# Patient Record
Sex: Female | Born: 1955 | Race: Black or African American | Hispanic: No | Marital: Married | Smoking: Former smoker
Health system: Southern US, Community
[De-identification: ages and names within clinical notes are randomized; demographics above are authoritative.]

## PROBLEM LIST (undated history)

## (undated) DIAGNOSIS — I10 Essential (primary) hypertension: Secondary | ICD-10-CM

## (undated) DIAGNOSIS — Z8719 Personal history of other diseases of the digestive system: Secondary | ICD-10-CM

## (undated) DIAGNOSIS — K219 Gastro-esophageal reflux disease without esophagitis: Secondary | ICD-10-CM

## (undated) HISTORY — PX: TUBAL LIGATION: SHX77

## (undated) HISTORY — DX: Gastro-esophageal reflux disease without esophagitis: K21.9

## (undated) HISTORY — PX: BREAST BIOPSY: SHX20

## (undated) HISTORY — DX: Essential (primary) hypertension: I10

---

## 2020-01-15 ENCOUNTER — Encounter: Payer: Self-pay | Admitting: Physician Assistant

## 2020-02-07 DIAGNOSIS — E7800 Pure hypercholesterolemia, unspecified: Secondary | ICD-10-CM | POA: Insufficient documentation

## 2020-02-07 DIAGNOSIS — K219 Gastro-esophageal reflux disease without esophagitis: Secondary | ICD-10-CM | POA: Insufficient documentation

## 2020-02-07 DIAGNOSIS — I10 Essential (primary) hypertension: Secondary | ICD-10-CM

## 2020-02-08 ENCOUNTER — Ambulatory Visit (INDEPENDENT_AMBULATORY_CARE_PROVIDER_SITE_OTHER): Payer: Commercial Managed Care - PPO | Admitting: Physician Assistant

## 2020-02-08 ENCOUNTER — Encounter: Payer: Self-pay | Admitting: Physician Assistant

## 2020-02-08 VITALS — BP 122/70 | HR 76 | Ht 62.0 in | Wt 194.4 lb

## 2020-02-08 DIAGNOSIS — K219 Gastro-esophageal reflux disease without esophagitis: Secondary | ICD-10-CM | POA: Diagnosis not present

## 2020-02-08 DIAGNOSIS — R1310 Dysphagia, unspecified: Secondary | ICD-10-CM

## 2020-02-08 MED ORDER — FAMOTIDINE 40 MG PO TABS: 40.0000 mg | ORAL_TABLET | Freq: Every day | ORAL | 11 refills | Status: DC

## 2020-02-08 MED ORDER — PANTOPRAZOLE SODIUM 40 MG PO TBEC
40.0000 mg | DELAYED_RELEASE_TABLET | Freq: Every morning | ORAL | 11 refills | Status: DC
Start: 2020-02-08 — End: 2020-05-12

## 2020-02-08 NOTE — Patient Instructions (Signed)
If you are age 63 or older, your body mass index should be between 23-30. Your Body mass index is 35.56 kg/m. If this is out of the aforementioned range listed, please consider follow up with your Primary Care Provider.  If you are age 46 or younger, your body mass index should be between 19-25. Your Body mass index is 35.56 kg/m. If this is out of the aformentioned range listed, please consider follow up with your Primary Care Provider.   You have been scheduled for an endoscopy. Please follow written instructions given to you at your visit today. If you use inhalers (even only as needed), please bring them with you on the day of your procedure.  Continue Pantoprazole 40 mg every morning. Start Famotidine 40 mg 1 tablet at be time.  Follow up Antireflux diet.  Follow up pending the results of your Endoscopy.

## 2020-02-08 NOTE — Progress Notes (Signed)
____________________________________________________________  Attending physician addendum:  Thank you for sending this case to me. I have reviewed the entire note and agree with the plan.   Dakwon Wenberg Danis, MD  ____________________________________________________________  

## 2020-02-08 NOTE — Progress Notes (Signed)
Subjective:    Patient ID: Destiny Woods, female    DOB: 1955/12/06, 64 y.o.   MRN: [id number]  HPI  Destiny Woods is a pleasant 64 year old African-American female, new to GI today referred by [facility]/Destiny Woods for evaluation of somewhat refractory GERD. Patient has not had prior EGD.  She reports having a colonoscopy done for screening in 2016 or 2017 at [facility] and was told to follow-up in 10 years with negative exam. She has history of hypertension, hyperlipidemia and obesity. She says she has been struggling with reflux symptoms over the past 3 to 4 years.  She has been on Nexium at one point, and now on Protonix over the past couple of years.  She feels the medication helps but does not control her symptoms well.  Despite taking Protonix 40 mg p.o. every morning she is continuing to have heartburn off and on all day long and frequent nocturnal symptoms with awakening with sour brash coughing and choking.  She is trying to sit up for at least 3 hours after eating prior to bedtime.  She endorses vague dysphagia symptoms with a sense of food sitting in her chest at times, no episodes of regurgitation.  She denies any abdominal pain or discomfort.  She has had problems in the past with hoarseness and did have ENT evaluation within the past couple of years which she was told was negative. Review of Systems Pertinent positive and negative review of systems were noted in the above HPI section.  All other review of systems was otherwise negative.  Outpatient Encounter Medications as of 02/08/2020  Medication Sig  . acetaminophen (TYLENOL 8 HOUR ARTHRITIS PAIN) 650 MG CR tablet Take 650 mg by mouth every 8 (eight) hours as needed for pain.  Marland Kitchen AMBULATORY NON FORMULARY MEDICATION Medication Name: Extra Strength Heartburn & Gas Chews- Use as needed  . Cal Carb-Mag Hydrox-Simeth (ROLAIDS ADVANCED PO) Take 2 tablets by mouth as needed.  . calcium carbonate (OS-CAL) 600 MG  TABS tablet Take 600 mg by mouth 2 (two) times daily with a meal.  . Cetirizine HCl (WAL-ZYR PO) Take 1 tablet by mouth daily as needed.  . Multiple Vitamins-Minerals (MULTIVITAMIN WOMENS 50+ ADV PO) Take 1 tablet by mouth daily.  . Omega-3 Fatty Acids (FISH OIL OMEGA-3 PO) Take 1 tablet by mouth daily.  . pantoprazole (PROTONIX) 40 MG tablet Take 1 tablet (40 mg total) by mouth in the morning.  . triamterene-hydrochlorothiazide (MAXZIDE-25) 37.5-25 MG tablet Take 1 tablet by mouth daily.  . valsartan-hydrochlorothiazide (DIOVAN-HCT) 80-12.5 MG tablet Take 1 tablet by mouth daily.  . Vitamin D, Ergocalciferol, 50 MCG (2000 UT) CAPS Take 1 capsule by mouth daily.  . [DISCONTINUED] pantoprazole (PROTONIX) 40 MG tablet Take 40 mg by mouth daily.  . [DISCONTINUED] ranitidine (ZANTAC) 150 MG capsule Take 150 mg by mouth as needed for heartburn.  . famotidine (PEPCID) 40 MG tablet Take 1 tablet (40 mg total) by mouth at bedtime.  . [DISCONTINUED] Biotin 5 MG CAPS Take 1 capsule by mouth daily.  . [DISCONTINUED] Cholecalciferol (VITAMIN D) 125 MCG (5000 UT) CAPS Take 1 tablet by mouth daily.  . [DISCONTINUED] Omega-3-6-9 CAPS Take 1 capsule by mouth daily.  . [DISCONTINUED] sucralfate (CARAFATE) 1 g tablet Take 1 g by mouth 4 (four) times daily -  with meals and at bedtime.   No facility-administered encounter medications on file as of 02/08/2020.   No Known Allergies Patient Active Problem List   Diagnosis Date Noted  .  HTN (hypertension) 02/07/2020  . GERD (gastroesophageal reflux disease) 02/07/2020  . Hypercholesteremia 02/07/2020   Social History   Socioeconomic History  . Marital status: Married    Spouse name: Not on file  . Number of children: Not on file  . Years of education: Not on file  . Highest education level: Not on file  Occupational History  . Not on file  Tobacco Use  . Smoking status: Former Games developer  . Smokeless tobacco: Never Used  Vaping Use  . Vaping Use: Never  used  Substance and Sexual Activity  . Alcohol use: Never  . Drug use: Not on file  . Sexual activity: Not on file  Other Topics Concern  . Not on file  Social History Narrative  . Not on file   Social Determinants of Health   Financial Resource Strain:   . Difficulty of Paying Living Expenses: Not on file  Food Insecurity:   . Worried About Programme researcher, broadcasting/film/video in the Last Year: Not on file  . Ran Out of Food in the Last Year: Not on file  Transportation Needs:   . Lack of Transportation (Medical): Not on file  . Lack of Transportation (Non-Medical): Not on file  Physical Activity:   . Days of Exercise per Week: Not on file  . Minutes of Exercise per Session: Not on file  Stress:   . Feeling of Stress : Not on file  Social Connections:   . Frequency of Communication with Friends and Family: Not on file  . Frequency of Social Gatherings with Friends and Family: Not on file  . Attends Religious Services: Not on file  . Active Member of Clubs or Organizations: Not on file  . Attends Banker Meetings: Not on file  . Marital Status: Not on file  Intimate Partner Violence:   . Fear of Current or Ex-Partner: Not on file  . Emotionally Abused: Not on file  . Physically Abused: Not on file  . Sexually Abused: Not on file    [name]'s family history is not on file.      Objective:    Vitals:   02/08/20 1129  BP: 122/70  Pulse: 76    Physical Exam Well-developed well-nourished AA female  in no acute distress.  Height, JHERDE,081 BMI 35.5  HEENT; nontraumatic normocephalic, EOMI, PE R LA, sclera anicteric. Oropharynx; not done Neck; supple, no JVD Cardiovascular; regular rate and rhythm with S1-S2, no murmur rub or gallop Pulmonary; Clear bilaterally Abdomen; soft, nontender, nondistended, no palpable mass or hepatosplenomegaly, bowel sounds are active Rectal;not done Skin; benign exam, no jaundice rash or appreciable lesions Extremities; no clubbing  cyanosis or edema skin warm and dry Neuro/Psych; alert and oriented x4, grossly nonfocal mood and affect appropriate       Assessment & Plan:   #47  64 year old female with chronic refractory GERD despite Protonix 40 mg p.o. every morning.  Symptoms manifested by daytime heartburn and indigestion and frequent nocturnal episodes of sour brash with coughing and choking. Patient also endorses vague dysphagia, consider peptic stricture.  #2 colon cancer screening-up-to-date with negative colonoscopy through [facility] 2016/17 and indicated for 10-year interval follow-up. #3 Hypertension #4 Hyperlipidemia #5 Obesity  Plan; continue Protonix 40 mg p.o. every morning AC breakfast, refill sent Add trial of famotidine 40 mg at at bedtime, prescription and refill sent. Reviewed an antireflux diet and antireflux regimen including n.p.o. for 3 hours prior to bedtime and elevation of the back about 45  degrees while sleeping. Patient will be scheduled for EGD with Dr. Myrtie Neither.  Possible esophageal dilation.  Procedure discussed in detail with patient including indications risks and benefits and she is agreeable to proceed. Patient has completed COVID-19 vaccination. Further recommendations pending findings at EGD. May need pH study/manometry. Kimerly Rowand S Jaelle Campanile PA-C 02/08/2020   Cc: Donetta Potts, MD

## 2020-03-19 ENCOUNTER — Telehealth: Payer: Self-pay | Admitting: Gastroenterology

## 2020-03-19 NOTE — Telephone Encounter (Signed)
Lm on vm for patient to return call 

## 2020-03-19 NOTE — Telephone Encounter (Signed)
Dr. Rhea Belton as DOD on 03/19/20  This is a Dr. Myrtie Neither patient - recently seen for GERD and dysphagia. Patient was started on Famotidine 40 mg at bedtime on 02/08/20.  Spoke with patient, she states that she started taking Pepcid on 02/08/20, about 2 weeks ago she noticed swelling in her face, a red rash on face and neck - fine bumps, whelps, stopped taking Pepcid for the last couple of days, whelps are gone, she states that she can't sleep at night due to coughing, coughing for about 2 weeks - still has cough but it's mostly at night. No trouble breathing, pt states that she did notice that she caught herself wheezing at times - no history of asthma, no history of COPD or other respiratory issues. Pt states that she took Tylenol every now and then - she is not sure if this has anything to do with it. Pt states that she has not taken any allergy medications, she states that she used some hydrocortisone cream for her face that her dermatologist had prescribed in the summer but it did not help with the itching. Pt denies any new foods or possible irritants. Advised patient to take a non-drowsy Benadryl to see if this helps with the itching and rash. Advised that I will send this to the DOD as they may want to change her medication. Please advise, thank you.

## 2020-03-19 NOTE — Telephone Encounter (Signed)
Pepcid is not uncommon allergy as this is a histamine blocking medication but her symptoms could certainly be an allergic reaction Agree with remaining off of the Pepcid Given nocturnal symptoms raising question of reflux despite pantoprazole 40 mg in the morning I would recommend she increase her pantoprazole to 40 mg twice daily before her first and last meal the day She should proceed with upper endoscopy as scheduled with Dr. Myrtie Neither in February

## 2020-03-20 ENCOUNTER — Other Ambulatory Visit: Payer: Self-pay

## 2020-03-20 NOTE — Telephone Encounter (Signed)
Spoke with patient and advised of Dr. Lauro Franklin recommendations. Advised patient to remain off of Pepcid and to increase her Pantoprazole to 40 mg twice a day, patient is aware that she needs to take medication 30 minutes before her first and last meal of the day. Advised patient to keep EGD as scheduled in February. Patient verbalized understanding of all information and had no concerns at the end of the call.

## 2020-03-23 NOTE — Telephone Encounter (Signed)
I agree this would be an uncommon reaction to famotidine but possible.  Increase to twice daily pantoprazole is fine with me until EGD.  - HD

## 2020-03-24 ENCOUNTER — Telehealth: Payer: Self-pay | Admitting: Physician Assistant

## 2020-03-24 NOTE — Telephone Encounter (Signed)
Pt is wanting to inform the nurse that the insurance will not cover the PEPCID, pt is requesting a cheaper alternative. Or if it could be changed to 20mg .  [facility]

## 2020-03-24 NOTE — Telephone Encounter (Signed)
Noted, patient is already aware.

## 2020-03-25 NOTE — Telephone Encounter (Signed)
Reviewed Formulary. Patient is currently on 40 mg which is covered by her insurance, but the 20 mg is lower cost due to it being a lower cost generic. I will contact the patient to discuss this.

## 2020-04-03 MED ORDER — FAMOTIDINE 20 MG PO TABS
40.0000 mg | ORAL_TABLET | Freq: Every day | ORAL | 9 refills | Status: DC
Start: 2020-04-03 — End: 2020-09-19

## 2020-04-03 NOTE — Telephone Encounter (Signed)
You have probably taken care of this - just closing the loop- ok to change to pepcid 20 mg /generic -

## 2020-04-25 ENCOUNTER — Encounter: Payer: Self-pay | Admitting: Gastroenterology

## 2020-04-25 ENCOUNTER — Ambulatory Visit (AMBULATORY_SURGERY_CENTER): Payer: Commercial Managed Care - PPO | Admitting: Gastroenterology

## 2020-04-25 ENCOUNTER — Other Ambulatory Visit: Payer: Self-pay

## 2020-04-25 ENCOUNTER — Telehealth: Payer: Self-pay

## 2020-04-25 VITALS — BP 143/72 | HR 69 | Temp 97.3°F | Resp 14 | Ht 62.0 in | Wt 194.0 lb

## 2020-04-25 DIAGNOSIS — R1319 Other dysphagia: Secondary | ICD-10-CM | POA: Diagnosis not present

## 2020-04-25 DIAGNOSIS — K219 Gastro-esophageal reflux disease without esophagitis: Secondary | ICD-10-CM

## 2020-04-25 DIAGNOSIS — K449 Diaphragmatic hernia without obstruction or gangrene: Secondary | ICD-10-CM

## 2020-04-25 MED ORDER — SODIUM CHLORIDE 0.9 % IV SOLN: 500.0000 mL | Freq: Once | INTRAVENOUS | Status: DC

## 2020-04-25 NOTE — Patient Instructions (Signed)
YOU HAD AN ENDOSCOPIC PROCEDURE TODAY AT THE [facility]:   Refer to the procedure report that was given to you for any specific questions about what was found during the examination.  If the procedure report does not answer your questions, please call your gastroenterologist to clarify.  If you requested that your care partner not be given the details of your procedure findings, then the procedure report has been included in a sealed envelope for you to review at your convenience later.  YOU SHOULD EXPECT: Some feelings of bloating in the abdomen. Passage of more gas than usual.  Walking can help get rid of the air that was put into your GI tract during the procedure and reduce the bloating. If you had a lower endoscopy (such as a colonoscopy or flexible sigmoidoscopy) you may notice spotting of blood in your stool or on the toilet paper. If you underwent a bowel prep for your procedure, you may not have a normal bowel movement for a few days.  Please Note:  You might notice some irritation and congestion in your nose or some drainage.  This is from the oxygen used during your procedure.  There is no need for concern and it should clear up in a day or so.  SYMPTOMS TO REPORT IMMEDIATELY:    Following upper endoscopy (EGD)  Vomiting of blood or coffee ground material  New chest pain or pain under the shoulder blades  Painful or persistently difficult swallowing  New shortness of breath  Fever of 100F or higher  Black, tarry-looking stools  For urgent or emergent issues, a gastroenterologist can be reached at any hour by calling [phone]. Do not use MyChart messaging for urgent concerns.    DIET:  We do recommend a small meal at first, but then you may proceed to your regular diet.  Drink plenty of fluids but you should avoid alcoholic beverages for 24 hours.  ACTIVITY:  You should plan to take it easy for the rest of today and you should NOT DRIVE or use heavy machinery  until tomorrow (because of the sedation medicines used during the test).    FOLLOW UP: Our staff will call the number listed on your records 48-72 hours following your procedure to check on you and address any questions or concerns that you may have regarding the information given to you following your procedure. If we do not reach you, we will leave a message.  We will attempt to reach you two times.  During this call, we will ask if you have developed any symptoms of COVID 19. If you develop any symptoms (ie: fever, flu-like symptoms, shortness of breath, cough etc.) before then, please call [phone].  If you test positive for Covid 19 in the 2 weeks post procedure, please call and report this information to us.    If any biopsies were taken you will be contacted by phone or by letter within the next 1-3 weeks.  Please call us at [phone] if you have not heard about the biopsies in 3 weeks.    SIGNATURES/CONFIDENTIALITY: You and/or your care partner have signed paperwork which will be entered into your electronic medical record.  These signatures attest to the fact that that the information above on your After Visit Summary has been reviewed and is understood.  Full responsibility of the confidentiality of this discharge information lies with you and/or your care-partner. 

## 2020-04-25 NOTE — Progress Notes (Signed)
Report to PACU, RN, vss, BBS= Clear.  

## 2020-04-25 NOTE — Op Note (Signed)
[facility] Patient Name: Destiny Woods Procedure Date: 04/25/2020 10:17 AM MRN: [id number] Endoscopist: Sherilyn Cooter L. Myrtie Neither , MD Age: 65 Referring MD:  Date of Birth: September 23, 1955 Gender: Female Account #: [id number] Procedure:                Upper GI endoscopy Indications:              Esophageal dysphagia, Esophageal reflux symptoms                            that persist despite appropriate therapy Medicines:                Monitored Anesthesia Care Procedure:                Pre-Anesthesia Assessment:                           - Prior to the procedure, a History and Physical                            was performed, and patient medications and                            allergies were reviewed. The patient's tolerance of                            previous anesthesia was also reviewed. The risks                            and benefits of the procedure and the sedation                            options and risks were discussed with the patient.                            All questions were answered, and informed consent                            was obtained. Prior Anticoagulants: The patient has                            taken no previous anticoagulant or antiplatelet                            agents. ASA Grade Assessment: II - A patient with                            mild systemic disease. After reviewing the risks                            and benefits, the patient was deemed in                            satisfactory condition to undergo the procedure.  After obtaining informed consent, the endoscope was                            passed under direct vision. Throughout the                            procedure, the patient's blood pressure, pulse, and                            oxygen saturations were monitored continuously. The                            Endoscope was introduced through the mouth, and                            advanced to the  second part of duodenum. The upper                            GI endoscopy was accomplished without difficulty.                            The patient tolerated the procedure well. Scope In: Scope Out: Findings:                 The lower third of the esophagus was tortuous.                           There is no endoscopic evidence of Barrett's                            esophagus, esophagitis or stricture in the entire                            esophagus.                           A 10 cm hiatal hernia (axial and transverse                            dimension) was present. At least one third of the                            stomach is herniated.                           The exam of the stomach was otherwise normal.                           The examined duodenum was normal. Complications:            No immediate complications. Estimated Blood Loss:     Estimated blood loss: none. Impression:               - Tortuous esophagus. This is due to the hernia and  accounts for the dysphagia.                           - 10 cm hiatal hernia.                           - Normal examined duodenum.                           - No specimens collected. Recommendation:           - Patient has a contact number available for                            emergencies. The signs and symptoms of potential                            delayed complications were discussed with the                            patient. Return to normal activities tomorrow.                            Written discharge instructions were provided to the                            patient.                           - Resume previous diet.                           - Continue present medications.                           - Perform routine esophageal manometry at                            appointment to be scheduled.                           - Refer to a surgeon to discuss hernia                             repair/fundoplication.                           - Follow an antireflux regimen. Merisa Julio L. Myrtie Neither, MD 04/25/2020 10:45:35 AM This report has been signed electronically.

## 2020-04-25 NOTE — Progress Notes (Signed)
VS by SM. 

## 2020-04-25 NOTE — Telephone Encounter (Signed)
-----   Message from Charlie Pitter III, MD sent at 04/25/2020 12:26 PM EST ----- This patient had an EGD today for GERD and dysphagia.  She has a large hiatal hernia and needs arrangements for the following:  Esophageal manometry  Referral to Dr. Wenda Low or Axel Filler at [facility] for hiatal hernia repair.  Discussed all with patient after her EGD and she is ready to proceed.  - HD

## 2020-04-25 NOTE — Telephone Encounter (Signed)
Referral, records, demographic and insurance information faxed to [facility]. Ptient is aware that they will be in contact to set up an appt.   Patient is scheduled for an esophageal manometry at [facility] on Friday, 05/02/20 at 10:30 AM, patient needs to arrive at 10 AM. COVID Test on Tuesday, 04/29/20 at 11:15 AM. Reviewed instructions with patient for esophageal manometry. Answered all of patient's questions, advised patient that she set up My Chart so that she can review the instructions again at her convenience as she is unable to pick up a copy because she lives in [place] and will not receive in the mail prior to appt. Patient verbalized understanding of all information and had no concerns at the end of the call.   Ambulatory referral in epic for esophageal manometry.

## 2020-04-28 ENCOUNTER — Telehealth: Payer: Self-pay | Admitting: Gastroenterology

## 2020-04-28 NOTE — Telephone Encounter (Signed)
Spoke with patient, she wanted to reschedule esophageal manometry. Patient has been rescheduled to Friday, 05/16/20 at 12:30 PM. COVID test is now on 05/13/20 at 11:15 AM. Advised that I will send updated appointment information via My Chart. Patient verbalized understanding and had no concerns at the end of the call.

## 2020-04-28 NOTE — Telephone Encounter (Signed)
Patient called requesting to reschedule procedure and covid test

## 2020-04-29 ENCOUNTER — Other Ambulatory Visit (HOSPITAL_COMMUNITY): Payer: Commercial Managed Care - PPO

## 2020-04-29 ENCOUNTER — Telehealth: Payer: Self-pay | Admitting: *Deleted

## 2020-04-29 NOTE — Telephone Encounter (Signed)
Unable to leave message on call back ,busy signal received ,no answering machine reached x2

## 2020-05-12 ENCOUNTER — Telehealth: Payer: Self-pay | Admitting: Physician Assistant

## 2020-05-12 MED ORDER — PANTOPRAZOLE SODIUM 40 MG PO TBEC: 40.0000 mg | DELAYED_RELEASE_TABLET | Freq: Every morning | ORAL | 9 refills | Status: DC

## 2020-05-12 NOTE — Telephone Encounter (Signed)
Refills have been sent to new location as requested.

## 2020-05-12 NOTE — Telephone Encounter (Signed)
Patient requesting refill on  Pantoprazole [facility] in [place] Fax: [phone]

## 2020-05-13 ENCOUNTER — Other Ambulatory Visit (HOSPITAL_COMMUNITY)
Admission: RE | Admit: 2020-05-13 | Discharge: 2020-05-13 | Disposition: A | Discharge status: Home / Self Care | Payer: Commercial Managed Care - PPO | Source: Ambulatory Visit | Attending: Gastroenterology | Admitting: Gastroenterology

## 2020-05-13 DIAGNOSIS — Z01812 Encounter for preprocedural laboratory examination: Secondary | ICD-10-CM | POA: Diagnosis present

## 2020-05-13 DIAGNOSIS — Z20822 Contact with and (suspected) exposure to covid-19: Secondary | ICD-10-CM | POA: Diagnosis not present

## 2020-05-13 LAB — SARS CORONAVIRUS 2 (TAT 6-24 HRS): SARS Coronavirus 2: NEGATIVE

## 2020-05-16 ENCOUNTER — Encounter (HOSPITAL_COMMUNITY)
Admission: RE | Disposition: A | Discharge status: Home / Self Care | Payer: Self-pay | Source: Home / Self Care | Attending: Gastroenterology

## 2020-05-16 ENCOUNTER — Ambulatory Visit (HOSPITAL_COMMUNITY)
Admission: RE | Admit: 2020-05-16 | Discharge: 2020-05-16 | Disposition: A | Discharge status: Home / Self Care | Payer: Commercial Managed Care - PPO | Attending: Gastroenterology | Admitting: Gastroenterology

## 2020-05-16 DIAGNOSIS — K219 Gastro-esophageal reflux disease without esophagitis: Secondary | ICD-10-CM | POA: Diagnosis not present

## 2020-05-16 DIAGNOSIS — K449 Diaphragmatic hernia without obstruction or gangrene: Secondary | ICD-10-CM | POA: Insufficient documentation

## 2020-05-16 HISTORY — PX: ESOPHAGEAL MANOMETRY: SHX5429

## 2020-05-16 SURGERY — MANOMETRY, ESOPHAGUS
Anesthesia: Choice

## 2020-05-16 MED ORDER — LIDOCAINE VISCOUS HCL 2 % MT SOLN
OROMUCOSAL | Status: AC
  Filled 2020-05-16: qty 15

## 2020-05-16 SURGICAL SUPPLY — 2 items
FACESHIELD LNG OPTICON STERILE (SAFETY) IMPLANT
GLOVE BIO SURGEON STRL SZ8 (GLOVE) ×4 IMPLANT

## 2020-05-16 NOTE — Progress Notes (Signed)
Esophageal manometry performed per protocol.  Patient tolerated well.  Report to be sent to Dr. Kavitha Nandigam 

## 2020-05-19 ENCOUNTER — Encounter (HOSPITAL_COMMUNITY): Payer: Self-pay | Admitting: Gastroenterology

## 2020-05-20 DIAGNOSIS — K449 Diaphragmatic hernia without obstruction or gangrene: Secondary | ICD-10-CM

## 2020-05-21 ENCOUNTER — Telehealth: Payer: Self-pay | Admitting: Gastroenterology

## 2020-05-21 NOTE — Telephone Encounter (Signed)
Spoke with patient, she states that she has an appt coming up on 05/23/20. Advised patient that she does not have any upcoming appointments scheduled with our office at this time, advised that we did refer her to [facility] and she may want to reach out to them to see if they have any upcoming appts scheduled for her. Provided patient with the telephone number to [facility], patient verbalized understanding and had no concerns at the end of the call.

## 2020-05-21 NOTE — Telephone Encounter (Signed)
Patient calling to speak with nurse regarding another procedure she was recommended to have. Not sure which procedure after menometry. Please advise

## 2020-05-22 ENCOUNTER — Ambulatory Visit: Payer: Self-pay | Admitting: General Surgery

## 2020-05-22 NOTE — H&P (Signed)
History of Present Illness Destiny Filler MD; 05/22/2020 10:46 AM) The patient is a 65 year old female who presents with a hiatal hernia. Referred by: Dr. Myrtie Neither Chief Complaint: Reflux  Patient is a 65 year old female, who comes in with a history of hypertension, and a long-standing history of a hiatal hernia. Patient states that she's had significant issues with reflux, regurgitation of undigested food, hoarse voice, cough, she feels that she has had this ongoing and his gotten significantly worse. Patient currently on Protonix and Pepcid with some relief. 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 Patient also manometry. We do not have those records currently. Patient has not had any imaging studies.  Patient had no previous abdominal surgery.     Past Surgical History Santiago Glad, [place]; 05/22/2020 10:38 AM) Oral Surgery   Diagnostic Studies History Santiago Glad, [place]; 05/22/2020 10:38 AM) Colonoscopy  5-10 years ago Mammogram  within last year Pap Smear  1-5 years ago  Allergies Marliss Coots, CNA; 05/22/2020 10:26 AM) No Known Drug Allergies  [05/22/2020]: Allergies Reconciled   Medication History Marliss Coots, CNA; 05/22/2020 10:27 AM) Famotidine (40MG  Tablet, Oral) Active. Heartburn Relief Max St (20MG  Tablet, Oral) Active. Hydrocortisone (2.5% Cream, External) Active. predniSONE (20MG  Tablet, Oral) Active. Valsartan-hydroCHLOROthiazide (80-12.5MG  Tablet, Oral) Active. Triamterene-HCTZ (37.5-25MG  Tablet, Oral) Active. Pantoprazole Sodium (40MG  Tablet DR, Oral) Active. Medications Reconciled  Social History , ; 05/22/2020 10:38 AM) Caffeine use  Coffee, Tea. No alcohol use  No drug use  Tobacco use  Former smoker.  Family History , Santiago Glad; 05/22/2020 10:38 AM) Family history unknown  First Degree Relatives   Pregnancy / Birth History 07/22/2020, Santiago Glad; 05/22/2020  10:38 AM) Age at menarche  13 years. Age of menopause  38-55 Gravida  2 Maternal age  84-25 Para  2  Other Problems [place], CMA; 05/22/2020 10:38 AM) Back Pain  Gastroesophageal Reflux Disease  High blood pressure     Review of Systems 46-48 MD; 05/22/2020 10:45 AM) General Not Present- Appetite Loss, Chills, Fatigue, Fever, Night Sweats, Weight Gain and Weight Loss. Skin Not Present- Change in Wart/Mole, Dryness, Hives, Jaundice, New Lesions, Non-Healing Wounds, Rash and Ulcer. HEENT Present- Hoarseness and Seasonal Allergies. Not Present- Earache, Hearing Loss, Nose Bleed, Oral Ulcers, Ringing in the Ears, Sinus Pain, Sore Throat, Visual Disturbances, Wears glasses/contact lenses and Yellow Eyes. Respiratory Present- Chronic Cough, Snoring and Wheezing. Not Present- Bloody sputum and Difficulty Breathing. Breast Not Present- Breast Mass, Breast Pain, Nipple Discharge and Skin Changes. Cardiovascular Not Present- Chest Pain, Difficulty Breathing Lying Down, Leg Cramps, Palpitations, Rapid Heart Rate, Shortness of Breath and Swelling of Extremities. Gastrointestinal Present- Nausea and Vomiting. Not Present- Abdominal Pain, Bloating, Bloody Stool, Change in Bowel Habits, Chronic diarrhea, Constipation, Difficulty Swallowing, Excessive gas, Gets full quickly at meals, Hemorrhoids, Indigestion and Rectal Pain. Female Genitourinary Not Present- Frequency, Nocturia, Painful Urination, Pelvic Pain and Urgency. Musculoskeletal Present- Back Pain and Joint Stiffness. Not Present- Joint Pain, Muscle Pain, Muscle Weakness and Swelling of Extremities. Neurological Not Present- Decreased Memory, Fainting, Headaches, Numbness, Seizures, Tingling, Tremor, Trouble walking and Weakness. Psychiatric Not Present- Anxiety, Bipolar, Change in Sleep Pattern, Depression, Fearful and Frequent crying. Endocrine Present- Hair Changes. Not Present- Cold Intolerance, Excessive Hunger, Heat  Intolerance, Hot flashes and New Diabetes. Hematology Not Present- Blood Thinners, Easy Bruising, Excessive bleeding, Gland problems, HIV and Persistent Infections. All other systems negative  Vitals Santiago Glad [place] CNA; 05/22/2020 10:27 AM) 05/22/2020 10:27 AM Weight:  198.13 lb Height: 62in Body Surface Area: 1.9 m Body Mass Index: 36.24 kg/m  Temp.: 97.66F  Pulse: 91 (Regular)  P.OX: 97% (Room air) BP: 140/70(Sitting, Left Arm, Standard)       Physical Exam Destiny Filler MD; 05/22/2020 10:46 AM) The physical exam findings are as follows: Note: Constitutional: No acute distress, conversant, appears stated age  Eyes: Anicteric sclerae, moist conjunctiva, no lid lag  Neck: No thyromegaly, trachea midline, no cervical lymphadenopathy  Lungs: Clear to auscultation biilaterally, normal respiratory effot  Cardiovascular: regular rate & rhythm, no murmurs, no peripheal edema, pedal pulses 2+  GI: Soft, no masses or hepatosplenomegaly, non-tender to palpation  MSK: Normal gait, no clubbing cyanosis, edema  Skin: No rashes, palpation reveals normal skin turgor  Psychiatric: Appropriate judgment and insight, oriented to person, place, and time    Assessment & Plan Destiny Filler MD; 05/22/2020 10:47 AM) HIATAL HERNIA (K44.9) Impression: Patient is a 65 year old female, history of hypertension, reflux, large hiatal hernia   1. Will proceed to the operating room for a robotic hiatal hernia repair with mesh and fundoplication  2. Discussed with patient the risks and benefits of the procedure to include but not limited to: Infection, bleeding, damage to structures, possible pneumothorax, possible recurrence. The patient voiced understanding and wishes to proceed.

## 2020-06-03 ENCOUNTER — Other Ambulatory Visit: Payer: Self-pay | Admitting: General Surgery

## 2020-06-03 DIAGNOSIS — K449 Diaphragmatic hernia without obstruction or gangrene: Secondary | ICD-10-CM

## 2020-06-10 ENCOUNTER — Telehealth: Payer: Self-pay | Admitting: Gastroenterology

## 2020-06-10 NOTE — Telephone Encounter (Signed)
Spoke with patient in regards to Dr. Myrtie Neither' recommendations. Patient will discontinue Pepcid, advised that she call us if she still feels like she has hair loss. She is aware that Pepcid is probably not helping control her symptoms and more relief will come after hernia repair. Patient verbalized understanding and had no concerns at the end of the call.

## 2020-06-10 NOTE — Telephone Encounter (Signed)
ERROR

## 2020-06-10 NOTE — Telephone Encounter (Signed)
Spoke with patient, she states that she re-started Pepcid in January and hair loss started about a month ago. She states that the hair loss is more noticeable now. No new medications in this time frame. Patient states that she previously had a rash on her face from the Pepcid in 02/2020 and it was discontinued at that time. Patient thinks the Pepcid is causing her hair loss or she is unsure if it was the anesthesia. Please advise, thank you.

## 2020-06-10 NOTE — Telephone Encounter (Signed)
It is fine to stop the famotidine.  It is probably not doing much to help control the reflux symptoms anyway at this point.  Her reflux symptoms are expected to improve considerably after her hiatal hernia repair.

## 2020-06-11 MED ORDER — SUCRALFATE 1 G PO TABS: ORAL_TABLET | ORAL | 1 refills | Status: DC

## 2020-06-11 MED ORDER — PANTOPRAZOLE SODIUM 40 MG PO TBEC: 40.0000 mg | DELAYED_RELEASE_TABLET | Freq: Two times a day (BID) | ORAL | 3 refills | Status: DC

## 2020-06-11 NOTE — Telephone Encounter (Signed)
Patient called to advise since she stopped the Pepcid she is now feeling really sick and is looking for further advise.

## 2020-06-11 NOTE — Telephone Encounter (Signed)
Patient states that when the refill request was sent to [place] it was only refilled for Pantoprazole 40 mg daily. Patient states that she did well on Pantoprazole 40 mg BID, will send in updated script. She will discontinue Pepcid and see how increased Pantoprazole works for her. She would like Carafate to be sent in just in case she still does not have any relief. Pt requested that Carafate be sent to 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 Patient verbalized understanding and had no concerns at the end of the call.

## 2020-06-11 NOTE — Telephone Encounter (Signed)
Spoke with patient, she states that she tried to go without taking the Pepcid last night and started having reflux symptoms. She reports sleeping on 3-4 pillows at night, she states that she is following all anti-reflux measures and usually does not eat within 3 hours of going to bed. Pt has not tried anything OTC like TUMS. Advised that per the note yesterday Dr. Myrtie Neither did not think Pepcid was really helping control reflux symptoms but more so the hernia. Please advise on any further recommendations, thank you.

## 2020-06-11 NOTE — Addendum Note (Signed)
Addended by: Missy Sabins on: 06/11/2020 02:36 PM   Modules accepted: Orders

## 2020-06-11 NOTE — Telephone Encounter (Signed)
My understanding is that she has been taking the pantoprazole 40 mg twice daily. If that is inaccurate, and she has only been on it once daily, then it can be increased to twice daily.  Only other options are:   - continue famotidine and perhaps have further hair loss until she can probably stop the medicine after upcoming hernia repair surgery  or   -  try carafate 1 gram tablet dissolved in 2 tablespoons of water and taken by mouth up to 4 times daily as needed to help control heartburn. If she opts to do that, please send the Rx for carafate 1 gram tablets with those instructions.  Disp#120 tablets, RF 1  - H. Danis

## 2020-06-17 ENCOUNTER — Ambulatory Visit
Admission: RE | Admit: 2020-06-17 | Discharge: 2020-06-17 | Disposition: A | Discharge status: Home / Self Care | Payer: Commercial Managed Care - PPO | Source: Ambulatory Visit | Attending: General Surgery | Admitting: General Surgery

## 2020-06-17 ENCOUNTER — Other Ambulatory Visit: Payer: Self-pay

## 2020-06-17 DIAGNOSIS — K449 Diaphragmatic hernia without obstruction or gangrene: Secondary | ICD-10-CM

## 2020-07-17 NOTE — Progress Notes (Signed)
Surgical Instructions    Your procedure is scheduled on 07/22/20.  Report to [facility] Main Entrance "A" at 05:30 A.M., then check in with the Admitting office.  Call this number if you have problems the morning of surgery:  [phone]   If you have any questions prior to your surgery date call [phone]: Open Monday-Friday 8am-4pm    Remember:  Do not eat after midnight the night before your surgery  You may drink clear liquids until 04:30am the morning of your surgery.   Clear liquids allowed are: Water, Non-Citrus Juices (without pulp), Carbonated Beverages, Clear Tea, Black Coffee Only, and Gatorade  Patient Instructions  . The night before surgery:  o No food after midnight. ONLY clear liquids after midnight  . The day of surgery (if you do NOT have diabetes):  o Drink ONE (1) Pre-Surgery Clear Ensure by 04:30am the morning of surgery. Drink in one sitting. Do not sip.  o This drink was given to you during your hospital  pre-op appointment visit. o Nothing else to drink after completing the  Pre-Surgery Clear Ensure.          If you have questions, please contact your surgeon's office.     Take these medicines the morning of surgery with A SIP OF WATER  famotidine (PEPCID)   As of today, STOP taking any Aspirin (unless otherwise instructed by your surgeon) Aleve, Naproxen, Ibuprofen, Motrin, Advil, Goody's, BC's, all herbal medications, fish oil, and all vitamins.                     Do not wear jewelry, make up, or nail polish            Do not wear lotions, powders, perfumes/colognes, or deodorant.            Do not shave 48 hours prior to surgery.              Do not bring valuables to the hospital.            [facility] is not responsible for any belongings or valuables.  Do NOT Smoke (Tobacco/Vaping) or drink Alcohol 24 hours prior to your procedure If you use a CPAP at night, you may bring all equipment for your overnight stay.   Contacts, glasses,  dentures or bridgework may not be worn into surgery, please bring cases for these belongings   For patients admitted to the hospital, discharge time will be determined by your treatment team.   Patients discharged the day of surgery will not be allowed to drive home, and someone needs to stay with them for 24 hours.    Special instructions:   - Preparing For Surgery  Before surgery, you can play an important role. Because skin is not sterile, your skin needs to be as free of germs as possible. You can reduce the number of germs on your skin by washing with CHG (chlorahexidine gluconate) Soap before surgery.  CHG is an antiseptic cleaner which kills germs and bonds with the skin to continue killing germs even after washing.    Oral Hygiene is also important to reduce your risk of infection.  Remember - BRUSH YOUR TEETH THE MORNING OF SURGERY WITH YOUR REGULAR TOOTHPASTE  Please do not use if you have an allergy to CHG or antibacterial soaps. If your skin becomes reddened/irritated stop using the CHG.  Do not shave (including legs and underarms) for at least 48 hours prior to first  CHG shower. It is OK to shave your face.  Please follow these instructions carefully.   1. Shower the NIGHT BEFORE SURGERY and the MORNING OF SURGERY  2. If you chose to wash your hair, wash your hair first as usual with your normal shampoo.  3. After you shampoo, rinse your hair and body thoroughly to remove the shampoo.  4. Wash Face and genitals (private parts) with your normal soap.   5.  Shower the NIGHT BEFORE SURGERY and the MORNING OF SURGERY with CHG Soap.   6. Use CHG Soap as you would any other liquid soap. You can apply CHG directly to the skin and wash gently with a scrungie or a clean washcloth.   7. Apply the CHG Soap to your body ONLY FROM THE NECK DOWN.  Do not use on open wounds or open sores. Avoid contact with your eyes, ears, mouth and genitals (private parts). Wash Face and  genitals (private parts)  with your normal soap.   8. Wash thoroughly, paying special attention to the area where your surgery will be performed.  9. Thoroughly rinse your body with warm water from the neck down.  10. DO NOT shower/wash with your normal soap after using and rinsing off the CHG Soap.  11. Pat yourself dry with a CLEAN TOWEL.  12. Wear CLEAN PAJAMAS to bed the night before surgery  13. Place CLEAN SHEETS on your bed the night before your surgery  14. DO NOT SLEEP WITH PETS.   Day of Surgery: Take a shower with CHG soap. Wear Clean/Comfortable clothing the morning of surgery Do not apply any deodorants/lotions.   Remember to brush your teeth WITH YOUR REGULAR TOOTHPASTE.   Please read over the following fact sheets that you were given.

## 2020-07-18 ENCOUNTER — Inpatient Hospital Stay (HOSPITAL_COMMUNITY)
Admission: RE | Admit: 2020-07-18 | Discharge: 2020-07-18 | Disposition: A | Discharge status: Home / Self Care | Payer: Commercial Managed Care - PPO | Source: Ambulatory Visit

## 2020-07-18 ENCOUNTER — Other Ambulatory Visit (HOSPITAL_COMMUNITY): Payer: Commercial Managed Care - PPO

## 2020-09-12 NOTE — Progress Notes (Signed)
Spoke with Tresa Endo RN at Dr. Jacinto Halim office;requested orders for this pt who is coming for her PAT appointment on Monday. Her surgery is Wednesday 6/29. Tresa Endo said she would get message to Dr. Derrell Lolling first thing Monday morning. He is out of the office today.

## 2020-09-12 NOTE — Progress Notes (Signed)
Sent message to Dr. Derrell Lolling requesting pre op orders for this patient. Her PAT appointmentment is Monday 6/27. Surgery is 6/29.

## 2020-09-15 ENCOUNTER — Encounter (HOSPITAL_COMMUNITY)
Admission: RE | Admit: 2020-09-15 | Discharge: 2020-09-15 | Disposition: A | Discharge status: Home / Self Care | Payer: Commercial Managed Care - PPO | Source: Ambulatory Visit | Attending: General Surgery | Admitting: General Surgery

## 2020-09-15 ENCOUNTER — Other Ambulatory Visit: Payer: Self-pay

## 2020-09-15 ENCOUNTER — Ambulatory Visit: Payer: Self-pay | Admitting: General Surgery

## 2020-09-15 ENCOUNTER — Other Ambulatory Visit (HOSPITAL_COMMUNITY): Payer: Commercial Managed Care - PPO

## 2020-09-15 ENCOUNTER — Encounter (HOSPITAL_COMMUNITY): Payer: Self-pay

## 2020-09-15 DIAGNOSIS — Z01818 Encounter for other preprocedural examination: Secondary | ICD-10-CM | POA: Insufficient documentation

## 2020-09-15 DIAGNOSIS — Z20822 Contact with and (suspected) exposure to covid-19: Secondary | ICD-10-CM | POA: Insufficient documentation

## 2020-09-15 HISTORY — DX: Personal history of other diseases of the digestive system: Z87.19

## 2020-09-15 LAB — BASIC METABOLIC PANEL
Anion gap: 8 (ref 5–15)
BUN: <5 mg/dL — ABNORMAL LOW (ref 8–23)
CO2: 30 mmol/L (ref 22–32)
Calcium: 9 mg/dL (ref 8.9–10.3)
Chloride: 100 mmol/L (ref 98–111)
Creatinine, Ser: 0.84 mg/dL (ref 0.44–1.00)
GFR, Estimated: >60 mL/min (ref >60)
Glucose, Bld: 97 mg/dL (ref 70–99)
Potassium: 3.3 mmol/L — ABNORMAL LOW (ref 3.5–5.1)
Sodium: 138 mmol/L (ref 135–145)

## 2020-09-15 LAB — CBC
HCT: 40.7 % (ref 36.0–46.0)
Hemoglobin: 13.2 g/dL (ref 12.0–15.0)
MCH: 30 pg (ref 26.0–34.0)
MCHC: 32.4 g/dL (ref 30.0–36.0)
MCV: 92.5 fL (ref 80.0–100.0)
Platelets: 315 10*3/uL (ref 150–400)
RBC: 4.4 MIL/uL (ref 3.87–5.11)
RDW: 13.6 % (ref 11.5–15.5)
WBC: 7 10*3/uL (ref 4.0–10.5)
nRBC: 0 % (ref 0.0–0.2)

## 2020-09-15 LAB — SARS CORONAVIRUS 2 (TAT 6-24 HRS): SARS Coronavirus 2: NEGATIVE

## 2020-09-15 NOTE — Progress Notes (Addendum)
Surgical Instructions    Your procedure is scheduled on September 17, 2020.  Report to [facility] Main Entrance "A" at 05:30 A.M., then check in with the Admitting office.  Call this number if you have problems the morning of surgery:  [phone]   If you have any questions prior to your surgery date call [phone]: Open Monday-Friday 8am-4pm   Remember:  Do not eat after midnight the night before your surgery  You may drink clear liquids until 04:30 the morning of your surgery.   Clear liquids allowed are: Water, Non-Citrus Juices (without pulp), Carbonated Beverages, Clear Tea, Black Coffee Only, and Gatorade  **Please complete your PRE-SURGERY ENSURE that was provided before by 4:30 am the morning of surgery.  Please, if able, drink it in one setting. DO NOT SIP.**     Take these medicines the morning of surgery with A SIP OF WATER:  Pantoprazole (PROTONIX)  If Needed: Acetaminophen (Tylenol)  As of today, STOP taking any Aspirin (unless otherwise instructed by your surgeon) Aleve, Naproxen, Ibuprofen, Motrin, Advil, Goody's, BC's, all herbal medications, fish oil, and all vitamins.          Do not wear jewelry or makeup Do not wear lotions, powders, perfumes/colognes, or deodorant. Do not shave 48 hours prior to surgery.   Do not bring valuables to the hospital. Do Not wear nail polish, gel polish, artificial nails, or any other type of covering on natural nails including finger and toenails. If patients have artificial nails, gel coating, etc. that need to be removed by a nail salon please have this removed prior to surgery or surgery may need to be canceled/delayed if the surgeon/ anesthesia feels like the patient is unable to be adequately monitored.             [facility] is not responsible for any belongings or valuables.  Do NOT Smoke (Tobacco/Vaping) or drink Alcohol 24 hours prior to your procedure If you use a CPAP at night, you may bring all equipment for your  overnight stay.   Contacts, glasses, dentures or bridgework may not be worn into surgery, please bring cases for these belongings   For patients admitted to the hospital, discharge time will be determined by your treatment team.   Patients discharged the day of surgery will not be allowed to drive home, and someone needs to stay with them for 24 hours.  ONLY 1 SUPPORT PERSON MAY BE PRESENT WHILE YOU ARE IN SURGERY. IF YOU ARE TO BE ADMITTED ONCE YOU ARE IN YOUR ROOM YOU WILL BE ALLOWED TWO (2) VISITORS.  Minor children may have two parents present. Special consideration for safety and communication needs will be reviewed on a case by case basis.  Special instructions:    Oral Hygiene is also important to reduce your risk of infection.  Remember - BRUSH YOUR TEETH THE MORNING OF SURGERY WITH YOUR REGULAR TOOTHPASTE   [place]- Preparing For Surgery  Before surgery, you can play an important role. Because skin is not sterile, your skin needs to be as free of germs as possible. You can reduce the number of germs on your skin by washing with CHG (chlorahexidine gluconate) Soap before surgery.  CHG is an antiseptic cleaner which kills germs and bonds with the skin to continue killing germs even after washing.     Please do not use if you have an allergy to CHG or antibacterial soaps. If your skin becomes reddened/irritated stop using the CHG.  Do  not shave (including legs and underarms) for at least 48 hours prior to first CHG shower. It is OK to shave your face.  Please follow these instructions carefully.     Shower the NIGHT BEFORE SURGERY and the MORNING OF SURGERY with CHG Soap.   If you chose to wash your hair, wash your hair first as usual with your normal shampoo. After you shampoo, rinse your hair and body thoroughly to remove the shampoo.  Then [employer] and genitals (private parts) with your normal soap and rinse thoroughly to remove soap.  After that Use CHG Soap as you would  any other liquid soap. You can apply CHG directly to the skin and wash gently with a scrungie or a clean washcloth.   Apply the CHG Soap to your body ONLY FROM THE NECK DOWN.  Do not use on open wounds or open sores. Avoid contact with your eyes, ears, mouth and genitals (private parts). Wash Face and genitals (private parts)  with your normal soap.   Wash thoroughly, paying special attention to the area where your surgery will be performed.  Thoroughly rinse your body with warm water from the neck down.  DO NOT shower/wash with your normal soap after using and rinsing off the CHG Soap.  Pat yourself dry with a CLEAN TOWEL.  Wear CLEAN PAJAMAS to bed the night before surgery  Place CLEAN SHEETS on your bed the night before your surgery  DO NOT SLEEP WITH PETS.   Day of Surgery:  Take a shower with CHG soap. Wear Clean/Comfortable clothing the morning of surgery Do not apply any deodorants/lotions.   Remember to brush your teeth WITH YOUR REGULAR TOOTHPASTE.   Please read over the following fact sheets that you were given.

## 2020-09-15 NOTE — H&P (Signed)
History of Present Illness  The patient is a 65 year old female who presents with a hiatal hernia. Referred by: Dr. Myrtie Neither Chief Complaint: Reflux   Patient is a 65 year old female, who comes in with a history of hypertension, and a long-standing history of a hiatal hernia. Patient states that she's had significant issues with reflux, regurgitation of undigested food, hoarse voice, cough, she feels that she has had this ongoing and his gotten significantly worse.  Patient currently on Protonix and Pepcid with some relief. 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  Patient also manometry.  We do not have those records currently. Patient has not had any imaging studies.   Patient had no previous abdominal surgery.         Past Surgical History Oral Surgery     Diagnostic Studies History  Colonoscopy   5-10 years ago Mammogram   within last year Pap Smear   1-5 years ago   Allergies  No Known Drug Allergies   [05/22/2020]: Allergies Reconciled     Medication History  Famotidine  (40MG  Tablet, Oral) Active. Heartburn Relief Max St  (20MG  Tablet, Oral) Active. Hydrocortisone  (2.5% Cream, External) Active. predniSONE  (20MG  Tablet, Oral) Active. Valsartan-hydroCHLOROthiazide  (80-12.5MG  Tablet, Oral) Active. Triamterene-HCTZ  (37.5-25MG  Tablet, Oral) Active. Pantoprazole Sodium  (40MG  Tablet DR, Oral) Active. Medications Reconciled    Social History  Caffeine use   Coffee, Tea. No alcohol use   No drug use   Tobacco use   Former smoker.   Family History  Family history unknown   First Degree Relatives     Pregnancy / Birth History  Age at menarche   13 years. Age of menopause   [age] Gravida   2 Maternal age   35-25 Para   2   Back Pain   Gastroesophageal Reflux Disease   High blood pressure         Review of Systems  General Not Present- Appetite Loss, Chills, Fatigue, Fever, Night Sweats, Weight Gain and Weight  Loss. Skin Not Present- Change in Wart/Mole, Dryness, Hives, Jaundice, New Lesions, Non-Healing Wounds, Rash and Ulcer. HEENT Present- Hoarseness and Seasonal Allergies. Not Present- Earache, Hearing Loss, Nose Bleed, Oral Ulcers, Ringing in the Ears, Sinus Pain, Sore Throat, Visual Disturbances, Wears glasses/contact lenses and Yellow Eyes. Respiratory Present- Chronic Cough, Snoring and Wheezing. Not Present- Bloody sputum and Difficulty Breathing. Breast Not Present- Breast Mass, Breast Pain, Nipple Discharge and Skin Changes. Cardiovascular Not Present- Chest Pain, Difficulty Breathing Lying Down, Leg Cramps, Palpitations, Rapid Heart Rate, Shortness of Breath and Swelling of Extremities. Gastrointestinal Present- Nausea and Vomiting. Not Present- Abdominal Pain, Bloating, Bloody Stool, Change in Bowel Habits, Chronic diarrhea, Constipation, Difficulty Swallowing, Excessive gas, Gets full quickly at meals, Hemorrhoids, Indigestion and Rectal Pain. Female Genitourinary Not Present- Frequency, Nocturia, Painful Urination, Pelvic Pain and Urgency. Musculoskeletal Present- Back Pain and Joint Stiffness. Not Present- Joint Pain, Muscle Pain, Muscle Weakness and Swelling of Extremities. Neurological Not Present- Decreased Memory, Fainting, Headaches, Numbness, Seizures, Tingling, Tremor, Trouble walking and Weakness. Psychiatric Not Present- Anxiety, Bipolar, Change in Sleep Pattern, Depression, Fearful and Frequent crying. Endocrine Present- Hair Changes. Not Present- Cold Intolerance, Excessive Hunger, Heat Intolerance, Hot flashes and New Diabetes. Hematology Not Present- Blood Thinners, Easy Bruising, Excessive bleeding, Gland problems, HIV and Persistent Infections. All other systems negative   Vitals  05/22/2020 10:27 AM Weight: 198.13 lb   Height: 62 in  Body Surface Area: 1.9  m   Body Mass Index: 36.24 kg/m   Temp.: 97.6 F    Pulse: 91 (Regular)    P.OX: 97% (Room air) BP: 140/70(Sitting,  Left Arm, Standard)             Physical Exam  The physical exam findings are as follows: Note:   Constitutional: No acute distress, conversant, appears stated age   Eyes: Anicteric sclerae, moist conjunctiva, no lid lag   Neck: No thyromegaly, trachea midline, no cervical lymphadenopathy   Lungs: Clear to auscultation biilaterally, normal respiratory effot   Cardiovascular: regular rate & rhythm, no murmurs, no peripheal edema, pedal pulses 2+   GI: Soft, no masses or hepatosplenomegaly, non-tender to palpation   MSK: Normal gait, no clubbing cyanosis, edema   Skin: No rashes, palpation reveals normal skin turgor   Psychiatric: Appropriate judgment and insight, oriented to person, place, and time       Assessment & Plan HIATAL HERNIA (K44.9) Impression: Patient is a 65 year old female, history of hypertension, reflux, large hiatal hernia     1. Will proceed to the operating room for a robotic hiatal hernia repair with mesh and fundoplication   2. Discussed with patient the risks and benefits of the procedure to include but not limited to: Infection, bleeding, damage to structures, possible pneumothorax, possible recurrence. The patient voiced understanding and wishes to proceed.

## 2020-09-15 NOTE — H&P (View-Only) (Signed)
History of Present Illness  The patient is a 65 year old female who presents with a hiatal hernia. Referred by: Dr. Danis Chief Complaint: Reflux   Patient is a 65-year-old female, who comes in with a history of hypertension, and a long-standing history of a hiatal hernia. Patient states that she's had significant issues with reflux, regurgitation of undigested food, hoarse voice, cough, she feels that she has had this ongoing and his gotten significantly worse.  Patient currently on Protonix and Pepcid with some relief. 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  Patient also manometry.  We do not have those records currently. Patient has not had any imaging studies.   Patient had no previous abdominal surgery.         Past Surgical History Oral Surgery     Diagnostic Studies History  Colonoscopy   5-10 years ago Mammogram   within last year Pap Smear   1-5 years ago   Allergies  No Known Drug Allergies   [05/22/2020]: Allergies Reconciled     Medication History  Famotidine  (40MG Tablet, Oral) Active. Heartburn Relief Max St  (20MG Tablet, Oral) Active. Hydrocortisone  (2.5% Cream, External) Active. predniSONE  (20MG Tablet, Oral) Active. Valsartan-hydroCHLOROthiazide  (80-12.5MG Tablet, Oral) Active. Triamterene-HCTZ  (37.5-25MG Tablet, Oral) Active. Pantoprazole Sodium  (40MG Tablet DR, Oral) Active. Medications Reconciled    Social History  Caffeine use   Coffee, Tea. No alcohol use   No drug use   Tobacco use   Former smoker.   Family History  Family history unknown   First Degree Relatives     Pregnancy / Birth History  Age at menarche   13 years. Age of menopause   51-55 Gravida   2 Maternal age   21-25 Para   2   Back Pain   Gastroesophageal Reflux Disease   High blood pressure         Review of Systems  General Not Present- Appetite Loss, Chills, Fatigue, Fever, Night Sweats, Weight Gain and Weight  Loss. Skin Not Present- Change in Wart/Mole, Dryness, Hives, Jaundice, New Lesions, Non-Healing Wounds, Rash and Ulcer. HEENT Present- Hoarseness and Seasonal Allergies. Not Present- Earache, Hearing Loss, Nose Bleed, Oral Ulcers, Ringing in the Ears, Sinus Pain, Sore Throat, Visual Disturbances, Wears glasses/contact lenses and Yellow Eyes. Respiratory Present- Chronic Cough, Snoring and Wheezing. Not Present- Bloody sputum and Difficulty Breathing. Breast Not Present- Breast Mass, Breast Pain, Nipple Discharge and Skin Changes. Cardiovascular Not Present- Chest Pain, Difficulty Breathing Lying Down, Leg Cramps, Palpitations, Rapid Heart Rate, Shortness of Breath and Swelling of Extremities. Gastrointestinal Present- Nausea and Vomiting. Not Present- Abdominal Pain, Bloating, Bloody Stool, Change in Bowel Habits, Chronic diarrhea, Constipation, Difficulty Swallowing, Excessive gas, Gets full quickly at meals, Hemorrhoids, Indigestion and Rectal Pain. Female Genitourinary Not Present- Frequency, Nocturia, Painful Urination, Pelvic Pain and Urgency. Musculoskeletal Present- Back Pain and Joint Stiffness. Not Present- Joint Pain, Muscle Pain, Muscle Weakness and Swelling of Extremities. Neurological Not Present- Decreased Memory, Fainting, Headaches, Numbness, Seizures, Tingling, Tremor, Trouble walking and Weakness. Psychiatric Not Present- Anxiety, Bipolar, Change in Sleep Pattern, Depression, Fearful and Frequent crying. Endocrine Present- Hair Changes. Not Present- Cold Intolerance, Excessive Hunger, Heat Intolerance, Hot flashes and New Diabetes. Hematology Not Present- Blood Thinners, Easy Bruising, Excessive bleeding, Gland problems, HIV and Persistent Infections. All other systems negative   Vitals  05/22/2020 10:27 AM Weight: 198.13 lb   Height: 62 in  Body Surface Area: 1.9   m   Body Mass Index: 36.24 kg/m   Temp.: 97.6 F    Pulse: 91 (Regular)    P.OX: 97% (Room air) BP: 140/70(Sitting,  Left Arm, Standard)             Physical Exam  The physical exam findings are as follows: Note:   Constitutional: No acute distress, conversant, appears stated age   Eyes: Anicteric sclerae, moist conjunctiva, no lid lag   Neck: No thyromegaly, trachea midline, no cervical lymphadenopathy   Lungs: Clear to auscultation biilaterally, normal respiratory effot   Cardiovascular: regular rate & rhythm, no murmurs, no peripheal edema, pedal pulses 2+   GI: Soft, no masses or hepatosplenomegaly, non-tender to palpation   MSK: Normal gait, no clubbing cyanosis, edema   Skin: No rashes, palpation reveals normal skin turgor   Psychiatric: Appropriate judgment and insight, oriented to person, place, and time       Assessment & Plan HIATAL HERNIA (K44.9) Impression: Patient is a 65 year old female, history of hypertension, reflux, large hiatal hernia     1. Will proceed to the operating room for a robotic hiatal hernia repair with mesh and fundoplication   2. Discussed with patient the risks and benefits of the procedure to include but not limited to: Infection, bleeding, damage to structures, possible pneumothorax, possible recurrence. The patient voiced understanding and wishes to proceed.

## 2020-09-15 NOTE — Progress Notes (Signed)
PCP: Dr. Mayford Knife, [facility] [place] Cardiologist: denies  EKG: 09/15/20 CXR: n/a ECHO: denies Stress Test: denies Cardiac Cath: denies  ERAS: Ensure drink provided  Covid tested today: Aware to limit exposure, wear mask  Patient denies shortness of breath, fever, cough, and chest pain at PAT appointment.  Patient verbalized understanding of instructions provided today at the PAT appointment.  Patient asked to review instructions at home and day of surgery.

## 2020-09-17 ENCOUNTER — Encounter (HOSPITAL_COMMUNITY)
Admission: RE | Disposition: A | Discharge status: Home / Self Care | Payer: Self-pay | Source: Home / Self Care | Attending: General Surgery

## 2020-09-17 ENCOUNTER — Other Ambulatory Visit: Payer: Self-pay

## 2020-09-17 ENCOUNTER — Inpatient Hospital Stay (HOSPITAL_COMMUNITY)
Admission: RE | Admit: 2020-09-17 | Discharge: 2020-09-19 | DRG: 328 | Disposition: A | Discharge status: Home / Self Care | Payer: Commercial Managed Care - PPO | Attending: General Surgery | Admitting: General Surgery

## 2020-09-17 ENCOUNTER — Inpatient Hospital Stay (HOSPITAL_COMMUNITY): Payer: Commercial Managed Care - PPO | Admitting: Anesthesiology

## 2020-09-17 ENCOUNTER — Encounter (HOSPITAL_COMMUNITY): Payer: Self-pay | Admitting: General Surgery

## 2020-09-17 ENCOUNTER — Inpatient Hospital Stay (HOSPITAL_COMMUNITY): Payer: Commercial Managed Care - PPO | Admitting: Emergency Medicine

## 2020-09-17 DIAGNOSIS — Z87891 Personal history of nicotine dependence: Secondary | ICD-10-CM

## 2020-09-17 DIAGNOSIS — Z9889 Other specified postprocedural states: Secondary | ICD-10-CM | POA: Diagnosis present

## 2020-09-17 DIAGNOSIS — K219 Gastro-esophageal reflux disease without esophagitis: Secondary | ICD-10-CM | POA: Diagnosis present

## 2020-09-17 DIAGNOSIS — I10 Essential (primary) hypertension: Secondary | ICD-10-CM | POA: Diagnosis present

## 2020-09-17 DIAGNOSIS — Z79899 Other long term (current) drug therapy: Secondary | ICD-10-CM

## 2020-09-17 DIAGNOSIS — Z20822 Contact with and (suspected) exposure to covid-19: Secondary | ICD-10-CM | POA: Diagnosis present

## 2020-09-17 DIAGNOSIS — K449 Diaphragmatic hernia without obstruction or gangrene: Secondary | ICD-10-CM | POA: Diagnosis present

## 2020-09-17 HISTORY — PX: XI ROBOTIC ASSISTED HIATAL HERNIA REPAIR: SHX6889

## 2020-09-17 SURGERY — REPAIR, HERNIA, HIATAL, ROBOT-ASSISTED
Anesthesia: General | Site: Abdomen

## 2020-09-17 MED ORDER — OXYCODONE HCL 5 MG PO TABS: 5.0000 mg | ORAL_TABLET | Freq: Once | ORAL | Status: DC | PRN

## 2020-09-17 MED ORDER — CHLORHEXIDINE GLUCONATE CLOTH 2 % EX PADS: 6.0000 | MEDICATED_PAD | Freq: Once | CUTANEOUS | Status: DC

## 2020-09-17 MED ORDER — SUGAMMADEX SODIUM 200 MG/2ML IV SOLN
INTRAVENOUS | Status: DC | PRN
  Administered 2020-09-17: 200 mg via INTRAVENOUS

## 2020-09-17 MED ORDER — 0.9 % SODIUM CHLORIDE (POUR BTL) OPTIME
TOPICAL | Status: DC | PRN
  Administered 2020-09-17: 1000 mL

## 2020-09-17 MED ORDER — PROMETHAZINE HCL 25 MG/ML IJ SOLN
INTRAMUSCULAR | Status: AC
  Administered 2020-09-17: 12.5 mg via INTRAVENOUS
  Filled 2020-09-17: qty 1

## 2020-09-17 MED ORDER — PROPOFOL 10 MG/ML IV BOLUS
INTRAVENOUS | Status: DC | PRN
  Administered 2020-09-17: 120 mg via INTRAVENOUS

## 2020-09-17 MED ORDER — HYDROMORPHONE HCL 1 MG/ML IJ SOLN
1.0000 mg | INTRAMUSCULAR | Status: DC | PRN
Start: 2020-09-17 — End: 2020-09-19
  Administered 2020-09-17: 2 mg via INTRAVENOUS
  Filled 2020-09-17: qty 1
  Filled 2020-09-17: qty 2

## 2020-09-17 MED ORDER — SODIUM CHLORIDE 0.9 % IV SOLN
INTRAVENOUS | Status: DC | PRN
  Administered 2020-09-17: 40 mL

## 2020-09-17 MED ORDER — ONDANSETRON HCL 4 MG/2ML IJ SOLN: 4.0000 mg | Freq: Four times a day (QID) | INTRAMUSCULAR | Status: AC | PRN

## 2020-09-17 MED ORDER — ENOXAPARIN SODIUM 40 MG/0.4ML IJ SOSY
40.0000 mg | PREFILLED_SYRINGE | INTRAMUSCULAR | Status: DC
  Administered 2020-09-18: 40 mg via SUBCUTANEOUS
  Filled 2020-09-17: qty 0.4

## 2020-09-17 MED ORDER — DEXTROSE-NACL 5-0.9 % IV SOLN: INTRAVENOUS | Status: DC

## 2020-09-17 MED ORDER — CEFAZOLIN SODIUM-DEXTROSE 2-4 GM/100ML-% IV SOLN
2.0000 g | INTRAVENOUS | Status: AC
  Administered 2020-09-17: 2 g via INTRAVENOUS
  Filled 2020-09-17: qty 100

## 2020-09-17 MED ORDER — OXYCODONE HCL 5 MG/5ML PO SOLN: 5.0000 mg | Freq: Once | ORAL | Status: DC | PRN

## 2020-09-17 MED ORDER — ONDANSETRON HCL 4 MG/2ML IJ SOLN
INTRAMUSCULAR | Status: DC | PRN
  Administered 2020-09-17: 4 mg via INTRAVENOUS

## 2020-09-17 MED ORDER — PROPOFOL 10 MG/ML IV BOLUS
INTRAVENOUS | Status: AC
  Filled 2020-09-17: qty 20

## 2020-09-17 MED ORDER — DEXMEDETOMIDINE (PRECEDEX) IN NS 20 MCG/5ML (4 MCG/ML) IV SYRINGE
PREFILLED_SYRINGE | INTRAVENOUS | Status: DC | PRN
  Administered 2020-09-17: 4 ug via INTRAVENOUS

## 2020-09-17 MED ORDER — ENSURE PRE-SURGERY PO LIQD: 296.0000 mL | Freq: Once | ORAL | Status: DC

## 2020-09-17 MED ORDER — ONDANSETRON 4 MG PO TBDP: 4.0000 mg | ORAL_TABLET | Freq: Four times a day (QID) | ORAL | Status: DC | PRN

## 2020-09-17 MED ORDER — BUPIVACAINE LIPOSOME 1.3 % IJ SUSP
INTRAMUSCULAR | Status: AC
  Filled 2020-09-17: qty 20

## 2020-09-17 MED ORDER — FENTANYL CITRATE (PF) 250 MCG/5ML IJ SOLN
INTRAMUSCULAR | Status: AC
  Filled 2020-09-17: qty 5

## 2020-09-17 MED ORDER — FENTANYL CITRATE (PF) 100 MCG/2ML IJ SOLN
INTRAMUSCULAR | Status: AC
  Administered 2020-09-17: 25 ug via INTRAVENOUS
  Filled 2020-09-17: qty 2

## 2020-09-17 MED ORDER — CHLORHEXIDINE GLUCONATE 0.12 % MT SOLN
15.0000 mL | Freq: Once | OROMUCOSAL | Status: AC
  Administered 2020-09-17: 15 mL via OROMUCOSAL
  Filled 2020-09-17: qty 15

## 2020-09-17 MED ORDER — LACTATED RINGERS IV SOLN: INTRAVENOUS | Status: DC

## 2020-09-17 MED ORDER — FENTANYL CITRATE (PF) 100 MCG/2ML IJ SOLN
INTRAMUSCULAR | Status: DC | PRN
  Administered 2020-09-17: 100 ug via INTRAVENOUS
  Administered 2020-09-17 (×3): 50 ug via INTRAVENOUS

## 2020-09-17 MED ORDER — ORAL CARE MOUTH RINSE: 15.0000 mL | Freq: Once | OROMUCOSAL | Status: AC

## 2020-09-17 MED ORDER — DEXAMETHASONE SODIUM PHOSPHATE 10 MG/ML IJ SOLN
INTRAMUSCULAR | Status: DC | PRN
  Administered 2020-09-17: 10 mg via INTRAVENOUS

## 2020-09-17 MED ORDER — BUPIVACAINE HCL (PF) 0.25 % IJ SOLN
INTRAMUSCULAR | Status: AC
  Filled 2020-09-17: qty 30

## 2020-09-17 MED ORDER — PROMETHAZINE HCL 25 MG/ML IJ SOLN: 12.5000 mg | Freq: Once | INTRAMUSCULAR | Status: AC

## 2020-09-17 MED ORDER — LIDOCAINE 2% (20 MG/ML) 5 ML SYRINGE
INTRAMUSCULAR | Status: DC | PRN
  Administered 2020-09-17: 50 mg via INTRAVENOUS

## 2020-09-17 MED ORDER — AMISULPRIDE (ANTIEMETIC) 5 MG/2ML IV SOLN
INTRAVENOUS | Status: AC
  Filled 2020-09-17: qty 4

## 2020-09-17 MED ORDER — ROCURONIUM BROMIDE 10 MG/ML (PF) SYRINGE
PREFILLED_SYRINGE | INTRAVENOUS | Status: DC | PRN
  Administered 2020-09-17: 30 mg via INTRAVENOUS
  Administered 2020-09-17: 70 mg via INTRAVENOUS

## 2020-09-17 MED ORDER — ONDANSETRON HCL 4 MG/2ML IJ SOLN
INTRAMUSCULAR | Status: AC
  Administered 2020-09-17: 4 mg via INTRAVENOUS
  Filled 2020-09-17: qty 2

## 2020-09-17 MED ORDER — BUPIVACAINE HCL (PF) 0.25 % IJ SOLN
INTRAMUSCULAR | Status: DC | PRN
  Administered 2020-09-17: 17 mL

## 2020-09-17 MED ORDER — AMISULPRIDE (ANTIEMETIC) 5 MG/2ML IV SOLN
10.0000 mg | Freq: Once | INTRAVENOUS | Status: AC
  Administered 2020-09-17: 10 mg via INTRAVENOUS

## 2020-09-17 MED ORDER — ACETAMINOPHEN 500 MG PO TABS
1000.0000 mg | ORAL_TABLET | ORAL | Status: AC
  Administered 2020-09-17: 1000 mg via ORAL
  Filled 2020-09-17: qty 2

## 2020-09-17 MED ORDER — ONDANSETRON HCL 4 MG/2ML IJ SOLN: 4.0000 mg | Freq: Four times a day (QID) | INTRAMUSCULAR | Status: DC | PRN

## 2020-09-17 MED ORDER — FENTANYL CITRATE (PF) 100 MCG/2ML IJ SOLN: 25.0000 ug | INTRAMUSCULAR | Status: DC | PRN

## 2020-09-17 SURGICAL SUPPLY — 50 items
CANNULA REDUC XI 12-8 STAPL (CANNULA) ×1
CANNULA REDUC XI 12-8MM STAPL (CANNULA) ×1
CANNULA REDUCER 12-8 DVNC XI (CANNULA) ×1 IMPLANT
CHLORAPREP W/TINT 26 (MISCELLANEOUS) ×3 IMPLANT
COVER MAYO STAND STRL (DRAPES) ×3 IMPLANT
COVER SURGICAL LIGHT HANDLE (MISCELLANEOUS) ×3 IMPLANT
DEFOGGER SCOPE WARMER CLEARIFY (MISCELLANEOUS) ×3 IMPLANT
DERMABOND ADVANCED (GAUZE/BANDAGES/DRESSINGS) ×2
DERMABOND ADVANCED .7 DNX12 (GAUZE/BANDAGES/DRESSINGS) ×1 IMPLANT
DEVICE TROCAR PUNCTURE CLOSURE (ENDOMECHANICALS) ×3 IMPLANT
DRAPE ARM DVNC X/XI (DISPOSABLE) ×4 IMPLANT
DRAPE CARDIOVASC SPLIT 88X140 (DRAPES) ×3 IMPLANT
DRAPE COLUMN DVNC XI (DISPOSABLE) ×1 IMPLANT
DRAPE DA VINCI XI ARM (DISPOSABLE) ×8
DRAPE DA VINCI XI COLUMN (DISPOSABLE) ×2
DRAPE ORTHO SPLIT 77X108 STRL (DRAPES) ×2
DRAPE SURG ORHT 6 SPLT 77X108 (DRAPES) ×1 IMPLANT
ELECT REM PT RETURN 9FT ADLT (ELECTROSURGICAL) ×3
ELECTRODE REM PT RTRN 9FT ADLT (ELECTROSURGICAL) ×1 IMPLANT
GAUZE 4X4 16PLY ~~LOC~~+RFID DBL (SPONGE) ×3 IMPLANT
GLOVE SURG ENC MOIS LTX SZ7.5 (GLOVE) ×3 IMPLANT
GOWN STRL REUS W/ TWL LRG LVL3 (GOWN DISPOSABLE) ×1 IMPLANT
GOWN STRL REUS W/ TWL XL LVL3 (GOWN DISPOSABLE) ×2 IMPLANT
GOWN STRL REUS W/TWL 2XL LVL3 (GOWN DISPOSABLE) ×3 IMPLANT
GOWN STRL REUS W/TWL LRG LVL3 (GOWN DISPOSABLE) ×3
GOWN STRL REUS W/TWL XL LVL3 (GOWN DISPOSABLE) ×4
KIT BASIN OR (CUSTOM PROCEDURE TRAY) ×3 IMPLANT
KIT TURNOVER KIT B (KITS) IMPLANT
MARKER SKIN DUAL TIP RULER LAB (MISCELLANEOUS) ×3 IMPLANT
MESH BIO-A 7X10 SYN MAT (Mesh General) ×3 IMPLANT
NEEDLE 22X1 1/2 (OR ONLY) (NEEDLE) ×3 IMPLANT
NEEDLE INSUFFLATION 14GA 120MM (NEEDLE) ×3 IMPLANT
OBTURATOR OPTICAL STANDARD 8MM (TROCAR) ×2
OBTURATOR OPTICAL STND 8 DVNC (TROCAR) ×1
OBTURATOR OPTICALSTD 8 DVNC (TROCAR) ×1 IMPLANT
SEAL CANN UNIV 5-8 DVNC XI (MISCELLANEOUS) ×3 IMPLANT
SEAL XI 5MM-8MM UNIVERSAL (MISCELLANEOUS) ×6
SEALER VESSEL DA VINCI XI (MISCELLANEOUS) ×3
SEALER VESSEL EXT DVNC XI (MISCELLANEOUS) ×1 IMPLANT
SET IRRIG TUBING LAPAROSCOPIC (IRRIGATION / IRRIGATOR) ×3 IMPLANT
SET TUBE SMOKE EVAC HIGH FLOW (TUBING) ×3 IMPLANT
SPONGE T-LAP 18X18 ~~LOC~~+RFID (SPONGE) ×3 IMPLANT
STAPLER CANNULA SEAL DVNC XI (STAPLE) ×1 IMPLANT
STAPLER CANNULA SEAL XI (STAPLE) ×2
STOPCOCK 4 WAY LG BORE MALE ST (IV SETS) ×3 IMPLANT
SUT ETHIBOND 0 36 GRN (SUTURE) ×6 IMPLANT
SUT MNCRL AB 4-0 PS2 18 (SUTURE) ×3 IMPLANT
SUT SILK 0 SH 30 (SUTURE) ×9 IMPLANT
TRAY LAPAROSCOPIC MC (CUSTOM PROCEDURE TRAY) ×3 IMPLANT
TROCAR ADV FIXATION 5X100MM (TROCAR) ×3 IMPLANT

## 2020-09-17 NOTE — Op Note (Signed)
09/17/2020  9:23 AM  PATIENT:  Destiny Woods  65 y.o. female  PRE-OPERATIVE DIAGNOSIS:  Hiatal Hernia, GERD  POST-OPERATIVE DIAGNOSIS:  Hiatal Hernia, GERD   PROCEDURE:  Procedure(s): XI ROBOTIC ASSISTED HIATAL HERNIA REPAIR WITH MESH AND  TOUPET FUNDOPLICATION (N/A)  SURGEON:  Surgeon(s) and Role:    Axel Filler, MD - Primary  ASSISTANTS: Berenda Morale, RNFA   ANESTHESIA:   local and general  EBL:  minimal   BLOOD ADMINISTERED:none  DRAINS: none   LOCAL MEDICATIONS USED:  BUPIVICAINE & Exparil  SPECIMEN:  No Specimen  DISPOSITION OF SPECIMEN:  N/A  COUNTS:  YES  TOURNIQUET:  * No tourniquets in log *  DICTATION: .Dragon Dictation  The patient was taken back to the operating room and placed in the supine position with bilateral SCDs in place. The patient was prepped and draped in the usual sterile fashion. After appropriate antibiotics were confirmed a timeout was called and all facts were verified.   A Veress needle technique was used to insufflate the abdomen to 15 mm of mercury the paramedian stab incision. Subsequent to this an 8 mm trocar was introduced as was a 8 millimeter camera. At this time the subsequent robotic trochars x2 were placed as was a 79mm trocar.  A 0 vicryl was placed with an endoclose to reapproximate the fascia.Each of the trochars were then placed adjacent to each other approximately 8-10 cm away. Each trocar was inserted under direct visualization, there were total of 4 trochars. The assistant trocar was then placed in the right lower quadrant under direct visualization. The Nathanson retractor was then visualized inserted into the abdomen and the incision just to the left of the falciform ligament. This was then placed to retract the liver appropriately. At this time the patient was positioned in reverse Trendelenburg.   At this time the robot patient cart was brought to the bedside and placed in good position and the arms were docked  to the trochars appropriately. At this time I proceeded to incised the gastrohepatic ligament.  At this time I proceeded to mobilize the stomach inferiorly and visualize the right crus. The peritoneum over the right crus was incised and right crus was identified. I proceeded to dissect this inferiorly until the left crus was seen joining the right crus. Once the right crus was adequately dissected we turned our to the left crus which was dissected away. This required traction of the stomach to the right side. Once this was visualized we then proceeded to circumferentially dissect the esophagus away from the surrounding tissue. The anterior and posterior vagus was seen along the esophagus at the GE junction.  These were both preserved throughout the entire case.  At this time the phrenoesophageal fat pad was dissected away from the esophagus. There was a small-sized hiatal hernia seen. I mobilized the esophagus cephalad approximately 4-5 cm, clearing away the surrounding tissue. The anterior hernia sac was dissected away from the stomach and esophagus.  At this time we turned our attention to the greater curvature the stomach and the omentum was mobilized using the robotic vessel sealer. This was taken up to the greater curvature to the hiatus. This mobilized the entire greater curvature to allow mobilization and the wrap. I then proceeded to bring the greater curvature the stomach posterior to the esophagus, and a shoeshine technique was used to evaluate the mobilization of the greater curvature.  The hernia sac was then dissected away from the stomach and esophagus.  At this time I proceeded to close the hiatus using interuppted 0 Ethibonds x 3. This brought together the hiatal closure without undue stricture to the esophagus.   A piece of Gore Bio A hiatal mesh was placed over the hiatal closure and sutured to the crus using 2-0 silk sutures x 4.  At this time the greater curvature was brought around the  esophagus and sutured using 0 silk sutures interrupted fashion approximately 1 cm apart x3 on each side of the esophagus in a Toupet fashion. A left collar stitch was then used to gastropexy the stomach from the wrap to the diaphragm just lateral to the left crus as.  A second collar stitch was placed from the wrap to the right crus.  The wrap lay loose with no strangulation of the esophagus.  At this time the robot was undocked. The liver trocar was removed. At this time insufflation was evacuated. Skin was reapproximated for Monocryl subcuticular fashion. The skin was then dressed with Dermabond. The patient tolerated the procedure well and was taken to the recovery room in stable condition.   PLAN OF CARE: Admit to inpatient   PATIENT DISPOSITION:  PACU - hemodynamically stable.   Delay start of Pharmacological VTE agent (>24hrs) due to surgical blood loss or risk of bleeding: not applicable

## 2020-09-17 NOTE — Plan of Care (Signed)

## 2020-09-17 NOTE — Discharge Instructions (Signed)
EATING AFTER YOUR ESOPHAGEAL SURGERY (Stomach Fundoplication, Hiatal Hernia repair, Achalasia surgery, etc)  ######################################################################  EAT Start with a pureed / full liquid diet (see below) Gradually transition to a high fiber diet with a fiber supplement over the next month after discharge.    WALK Walk an hour a day.  Control your pain to do that.    CONTROL PAIN Control pain so that you can walk, sleep, tolerate sneezing/coughing, go up/down stairs.  HAVE A BOWEL MOVEMENT DAILY Keep your bowels regular to avoid problems.  OK to try a laxative to override constipation.  OK to use an antidairrheal to slow down diarrhea.  Call if not better after 2 tries  CALL IF YOU HAVE PROBLEMS/CONCERNS Call if you are still struggling despite following these instructions. Call if you have concerns not answered by these instructions  ######################################################################   After your esophageal surgery, expect some sticking with swallowing over the next 1-2 months.    If food sticks when you eat, it is called "dysphagia".  This is due to swelling around your esophagus at the wrap & hiatal diaphragm repair.  It will gradually ease off over the next few months.  To help you through this temporary phase, we start you out on a pureed (blenderized) diet.  Your first meal in the hospital was thin liquids.  You should have been given a pureed diet by the time you left the hospital.  We ask patients to stay on a pureed diet for the first 2-3 weeks to avoid anything getting "stuck" near your recent surgery.  Don't be alarmed if your ability to swallow doesn't progress according to this plan.  Everyone is different and some diets can advance more or less quickly.    It is often helpful to crush your medications or split them as they can sometimes stick, especially the first week or so.   Some BASIC RULES to follow  are:  Maintain an upright position whenever eating or drinking.  Take small bites - just a teaspoon size bite at a time.  Eat slowly.  It may also help to eat only one food at a time.  Consider nibbling through smaller, more frequent meals & avoid the urge to eat BIG meals  Do not push through feelings of fullness, nausea, or bloatedness  Do not mix solid foods and liquids in the same mouthful  Try not to "wash foods down" with large gulps of liquids.  Avoid carbonated (bubbly/fizzy) drinks.    Avoid foods that make you feel gassy or bloated.  Start with bland foods first.  Wait on trying greasy, fried, or spicy meals until you are tolerating more bland solids well.  Understand that it will be hard to burp and belch at first.  This gradually improves with time.  Expect to be more gassy/flatulent/bloated initially.  Walking will help your body manage it better.  Consider using medications for bloating that contain simethicone such as  Maalox or Gas-X   Consider crushing her medications, especially smaller pills.  The ability to swallow pills should get easier after a few weeks  Eat in a relaxed atmosphere & minimize distractions.  Avoid talking while eating.    Do not use straws.  Following each meal, sit in an upright position (90 degree angle) for 60 to 90 minutes.  Going for a short walk can help as well  If food does stick, don't panic.  Try to relax and let the food pass on its own.    Sipping WARM LIQUID such as strong hot black tea can also help slide it down.   Be gradual in changes & use common sense:  -If you easily tolerating a certain "level" of foods, advance to the next level gradually -If you are having trouble swallowing a particular food, then avoid it.   -If food is sticking when you advance your diet, go back to thinner previous diet (the lower LEVEL) for 1-2 days.  LEVEL 1 = PUREED DIET  Do for the first 2 WEEKS AFTER SURGERY  -Foods in this group are  pureed or blenderized to a smooth, mashed potato-like consistency.  -If necessary, the pureed foods can keep their shape with the addition of a thickening agent.   -Meat should be pureed to a smooth, pasty consistency.  Hot broth or gravy may be added to the pureed meat, approximately 1 oz. of liquid per 3 oz. serving of meat. -CAUTION:  If any foods do not puree into a smooth consistency, swallowing will be more difficult.  (For example, nuts or seeds sometimes do not blend well.)  Hot Foods Cold Foods  Pureed scrambled eggs and cheese Pureed cottage cheese  Baby cereals Thickened juices and nectars  Thinned cooked cereals (no lumps) Thickened milk or eggnog  Pureed French toast or pancakes Ensure  Mashed potatoes Ice cream  Pureed parsley, au gratin, scalloped potatoes, candied sweet potatoes Fruit or Italian ice, sherbet  Pureed buttered or alfredo noodles Plain yogurt  Pureed vegetables (no corn or peas) Instant breakfast  Pureed soups and creamed soups Smooth pudding, mousse, custard  Pureed scalloped apples Whipped gelatin  Gravies Sugar, syrup, honey, jelly  Sauces, cheese, tomato, barbecue, white, creamed Cream  Any baby food Creamer  Alcohol in moderation (not beer or champagne) Margarine  Coffee or tea Mayonnaise   Ketchup, mustard   Apple sauce   SAMPLE MENU:  PUREED DIET Breakfast Lunch Dinner   Orange juice, 1/2 cup  Cream of wheat, 1/2 cup  Pineapple juice, 1/2 cup  Pureed turkey, barley soup, 3/4 cup  Pureed Hawaiian chicken, 3 oz   Scrambled eggs, mashed or blended with cheese, 1/2 cup  Tea or coffee, 1 cup   Whole milk, 1 cup   Non-dairy creamer, 2 Tbsp.  Mashed potatoes, 1/2 cup  Pureed cooled broccoli, 1/2 cup  Apple sauce, 1/2 cup  Coffee or tea  Mashed potatoes, 1/2 cup  Pureed spinach, 1/2 cup  Frozen yogurt, 1/2 cup  Tea or coffee      LEVEL 2 = SOFT DIET  After your first 2 weeks, you can advance to a soft diet.   Keep on this  diet until everything goes down easily.  Hot Foods Cold Foods  White fish Cottage cheese  Stuffed fish Junior baby fruit  Baby food meals Semi thickened juices  Minced soft cooked, scrambled, poached eggs nectars  Souffle & omelets Ripe mashed bananas  Cooked cereals Canned fruit, pineapple sauce, milk  potatoes Milkshake  Buttered or Alfredo noodles Custard  Cooked cooled vegetable Puddings, including tapioca  Sherbet Yogurt  Vegetable soup or alphabet soup Fruit ice, Italian ice  Gravies Whipped gelatin  Sugar, syrup, honey, jelly Junior baby desserts  Sauces:  Cheese, creamed, barbecue, tomato, white Cream  Coffee or tea Margarine   SAMPLE MENU:  LEVEL 2 Breakfast Lunch Dinner   Orange juice, 1/2 cup  Oatmeal, 1/2 cup  Scrambled eggs with cheese, 1/2 cup  Decaffeinated tea, 1 cup  Whole milk, 1 cup    Non-dairy creamer, 2 Tbsp  Pineapple juice, 1/2 cup  Minced beef, 3 oz  Gravy, 2 Tbsp  Mashed potatoes, 1/2 cup  Minced fresh broccoli, 1/2 cup  Applesauce, 1/2 cup  Coffee, 1 cup  Turkey, barley soup, 3/4 cup  Minced Hawaiian chicken, 3 oz  Mashed potatoes, 1/2 cup  Cooked spinach, 1/2 cup  Frozen yogurt, 1/2 cup  Non-dairy creamer, 2 Tbsp      LEVEL 3 = CHOPPED DIET  -After all the foods in level 2 (soft diet) are passing through well you should advance up to more chopped foods.  -It is still important to cut these foods into small pieces and eat slowly.  Hot Foods Cold Foods  Poultry Cottage cheese  Chopped Swedish meatballs Yogurt  Meat salads (ground or flaked meat) Milk  Flaked fish (tuna) Milkshakes  Poached or scrambled eggs Soft, cold, dry cereal  Souffles and omelets Fruit juices or nectars  Cooked cereals Chopped canned fruit  Chopped French toast or pancakes Canned fruit cocktail  Noodles or pasta (no rice) Pudding, mousse, custard  Cooked vegetables (no frozen peas, corn, or mixed vegetables) Green salad  Canned small sweet peas  Ice cream  Creamed soup or vegetable soup Fruit ice, Italian ice  Pureed vegetable soup or alphabet soup Non-dairy creamer  Ground scalloped apples Margarine  Gravies Mayonnaise  Sauces:  Cheese, creamed, barbecue, tomato, white Ketchup  Coffee or tea Mustard   SAMPLE MENU:  LEVEL 3 Breakfast Lunch Dinner   Orange juice, 1/2 cup  Oatmeal, 1/2 cup  Scrambled eggs with cheese, 1/2 cup  Decaffeinated tea, 1 cup  Whole milk, 1 cup  Non-dairy creamer, 2 Tbsp  Ketchup, 1 Tbsp  Margarine, 1 tsp  Salt, 1/4 tsp  Sugar, 2 tsp  Pineapple juice, 1/2 cup  Ground beef, 3 oz  Gravy, 2 Tbsp  Mashed potatoes, 1/2 cup  Cooked spinach, 1/2 cup  Applesauce, 1/2 cup  Decaffeinated coffee  Whole milk  Non-dairy creamer, 2 Tbsp  Margarine, 1 tsp  Salt, 1/4 tsp  Pureed turkey, barley soup, 3/4 cup  Barbecue chicken, 3 oz  Mashed potatoes, 1/2 cup  Ground fresh broccoli, 1/2 cup  Frozen yogurt, 1/2 cup  Decaffeinated tea, 1 cup  Non-dairy creamer, 2 Tbsp  Margarine, 1 tsp  Salt, 1/4 tsp  Sugar, 1 tsp    LEVEL 4:  REGULAR FOODS  -Foods in this group are soft, moist, regularly textured foods.   -This level includes meat and breads, which tend to be the hardest things to swallow.   -Eat very slowly, chew well and continue to avoid carbonated drinks. -most people are at this level in 4-6 weeks  Hot Foods Cold Foods  Baked fish or skinned Soft cheeses - cottage cheese  Souffles and omelets Cream cheese  Eggs Yogurt  Stuffed shells Milk  Spaghetti with meat sauce Milkshakes  Cooked cereal Cold dry cereals (no nuts, dried fruit, coconut)  French toast or pancakes Crackers  Buttered toast Fruit juices or nectars  Noodles or pasta (no rice) Canned fruit  Potatoes (all types) Ripe bananas  Soft, cooked vegetables (no corn, lima, or baked beans) Peeled, ripe, fresh fruit  Creamed soups or vegetable soup Cakes (no nuts, dried fruit, coconut)  Canned chicken  noodle soup Plain doughnuts  Gravies Ice cream  Bacon dressing Pudding, mousse, custard  Sauces:  Cheese, creamed, barbecue, tomato, white Fruit ice, Italian ice, sherbet  Decaffeinated tea or coffee Whipped gelatin  Pork chops Regular gelatin     Canned fruited gelatin molds   Sugar, syrup, honey, jam, jelly   Cream   Non-dairy   Margarine   Oil   Mayonnaise   Ketchup   Mustard   TROUBLESHOOTING IRREGULAR BOWELS  1) Avoid extremes of bowel movements (no bad constipation/diarrhea)  2) Miralax 17gm mixed in 8oz. water or juice-daily. May use BID as needed.  3) Gas-x,Phazyme, etc. as needed for gas & bloating.  4) Soft,bland diet. No spicy,greasy,fried foods.  5) Prilosec over-the-counter as needed  6) May hold gluten/wheat products from diet to see if symptoms improve.  7) May try probiotics (Align, Activa, etc) to help calm the bowels down  7) If symptoms become worse call back immediately.    If you have any questions please call our office at [facility]: [phone].  

## 2020-09-17 NOTE — Anesthesia Procedure Notes (Signed)
Procedure Name: Intubation Date/Time: 09/17/2020 7:37 AM Performed by: Lynnell Chad, CRNA Pre-anesthesia Checklist: Patient identified, Emergency Drugs available, Suction available and Patient being monitored Patient Re-evaluated:Patient Re-evaluated prior to induction Oxygen Delivery Method: Circle System Utilized Preoxygenation: Pre-oxygenation with 100% oxygen Induction Type: IV induction Ventilation: Mask ventilation without difficulty Tube type: Oral Tube size: 7.0 mm Number of attempts: 1 Airway Equipment and Method: Stylet and Oral airway Placement Confirmation: ETT inserted through vocal cords under direct vision, positive ETCO2 and breath sounds checked- equal and bilateral Secured at: 22 cm Tube secured with: Tape Dental Injury: Teeth and Oropharynx as per pre-operative assessment

## 2020-09-17 NOTE — Interval H&P Note (Signed)
History and Physical Interval Note:  09/17/2020 7:17 AM  Destiny Woods  has presented today for surgery, with the diagnosis of hernia.  The various methods of treatment have been discussed with the patient and family. After consideration of risks, benefits and other options for treatment, the patient has consented to  Procedure(s): XI ROBOTIC ASSISTED HIATAL HERNIA REPAIR WITH MESH AND FUNDOPLICATION (N/A) as a surgical intervention.  The patient's history has been reviewed, patient examined, no change in status, stable for surgery.  I have reviewed the patient's chart and labs.  Questions were answered to the patient's satisfaction.     Axel Filler

## 2020-09-17 NOTE — Anesthesia Preprocedure Evaluation (Signed)
Anesthesia Evaluation  Patient identified by MRN, date of birth, ID band Patient awake    Reviewed: Allergy & Precautions, H&P , NPO status , Patient's Chart, lab work & pertinent test results  Airway Mallampati: II   Neck ROM: full    Dental   Pulmonary former smoker,    breath sounds clear to auscultation       Cardiovascular hypertension,  Rhythm:regular Rate:Normal     Neuro/Psych    GI/Hepatic hiatal hernia, GERD  ,  Endo/Other    Renal/GU      Musculoskeletal   Abdominal   Peds  Hematology   Anesthesia Other Findings   Reproductive/Obstetrics                             Anesthesia Physical Anesthesia Plan  ASA: 2  Anesthesia Plan: General   Post-op Pain Management:    Induction: Intravenous  PONV Risk Score and Plan: 3 and Ondansetron, Dexamethasone, Midazolam and Treatment may vary due to age or medical condition  Airway Management Planned: Oral ETT  Additional Equipment:   Intra-op Plan:   Post-operative Plan: Extubation in OR  Informed Consent: I have reviewed the patients History and Physical, chart, labs and discussed the procedure including the risks, benefits and alternatives for the proposed anesthesia with the patient or authorized representative who has indicated his/her understanding and acceptance.     Dental advisory given  Plan Discussed with: CRNA, Anesthesiologist and Surgeon  Anesthesia Plan Comments:         Anesthesia Quick Evaluation

## 2020-09-17 NOTE — Transfer of Care (Signed)
Immediate Anesthesia Transfer of Care Note  Patient: Destiny Woods  Procedure(s) Performed: XI ROBOTIC ASSISTED HIATAL HERNIA REPAIR WITH MESH AND FUNDOPLICATION (Abdomen)  Patient Location: PACU  Anesthesia Type:General  Level of Consciousness: drowsy  Airway & Oxygen Therapy: Patient Spontanous Breathing and Patient connected to face mask oxygen  Post-op Assessment: Report given to RN and Post -op Vital signs reviewed and stable  Post vital signs: Reviewed and stable  Last Vitals:  Vitals Value Taken Time  BP 152/81 09/17/20 0947  Temp 36.1 C 09/17/20 0947  Pulse 89 09/17/20 0955  Resp 12 09/17/20 0955  SpO2 92 % 09/17/20 0955  Vitals shown include unvalidated device data.  Last Pain:  Vitals:   09/17/20 0947  TempSrc:   PainSc: Asleep         Complications: No notable events documented.

## 2020-09-18 ENCOUNTER — Encounter (HOSPITAL_COMMUNITY): Payer: Self-pay | Admitting: General Surgery

## 2020-09-18 ENCOUNTER — Inpatient Hospital Stay (HOSPITAL_COMMUNITY): Payer: Commercial Managed Care - PPO

## 2020-09-18 LAB — BASIC METABOLIC PANEL
Anion gap: 7 (ref 5–15)
BUN: <5 mg/dL — ABNORMAL LOW (ref 8–23)
CO2: 31 mmol/L (ref 22–32)
Calcium: 8.5 mg/dL — ABNORMAL LOW (ref 8.9–10.3)
Chloride: 102 mmol/L (ref 98–111)
Creatinine, Ser: 0.79 mg/dL (ref 0.44–1.00)
GFR, Estimated: >60 mL/min (ref >60)
Glucose, Bld: 126 mg/dL — ABNORMAL HIGH (ref 70–99)
Potassium: 3.7 mmol/L (ref 3.5–5.1)
Sodium: 140 mmol/L (ref 135–145)

## 2020-09-18 MED ORDER — IOHEXOL 300 MG/ML  SOLN
100.0000 mL | Freq: Once | INTRAMUSCULAR | Status: DC | PRN
Start: 2020-09-18 — End: 2020-09-19

## 2020-09-18 MED ORDER — ACETAMINOPHEN 325 MG PO TABS: 650.0000 mg | ORAL_TABLET | Freq: Four times a day (QID) | ORAL | Status: DC | PRN

## 2020-09-18 MED ORDER — HYDROCODONE-ACETAMINOPHEN 7.5-325 MG/15ML PO SOLN: 15.0000 mL | Freq: Four times a day (QID) | ORAL | 0 refills | Status: AC | PRN

## 2020-09-18 NOTE — Progress Notes (Signed)
1 Day Post-Op   Subjective/Chief Complaint: Pt with no issues overnight Some chest pain that has resolved Mobilizing    Objective: Vital signs in last 24 hours: Temp:  [97 F (36.1 C)-99 F (37.2 C)] 98.4 F (36.9 C) (06/30 0549) Pulse Rate:  [77-93] 85 (06/30 0549) Resp:  [14-20] 18 (06/30 0549) BP: (132-158)/(63-86) 132/63 (06/30 0549) SpO2:  [93 %-99 %] 97 % (06/30 0549)    Intake/Output from previous day: 06/29 0701 - 06/30 0700 In: 1165.6 [I.V.:1165.6] Out: -  Intake/Output this shift: No intake/output data recorded.  General appearance: alert and cooperative GI: soft, non-tender; bowel sounds normal; no masses,  no organomegaly  Lab Results:  Recent Labs    09/15/20 1530  WBC 7.0  HGB 13.2  HCT 40.7  PLT 315   BMET Recent Labs    09/15/20 1530 09/18/20 0239  NA 138 140  K 3.3* 3.7  CL 100 102  CO2 30 31  GLUCOSE 97 126*  BUN <5* <5*  CREATININE 0.84 0.79  CALCIUM 9.0 8.5*   PT/INR No results for input(s): LABPROT, INR in the last 72 hours. ABG No results for input(s): PHART, HCO3 in the last 72 hours.  Invalid input(s): PCO2, PO2  Studies/Results: No results found.  Anti-infectives: Anti-infectives (From admission, onward)    Start     Dose/Rate Route Frequency Ordered Stop   09/17/20 0600  ceFAZolin (ANCEF) IVPB 2g/100 mL premix        2 g 200 mL/hr over 30 Minutes Intravenous On call to O.R. 09/17/20 0545 09/17/20 0746       Assessment/Plan: s/p Procedure(s): XI ROBOTIC ASSISTED HIATAL HERNIA REPAIR WITH MESH AND FUNDOPLICATION (N/A) Awaiting DG esoph today Ambulate in hallway If doing well today will DC later this PM  LOS: 1 day    Axel Filler 09/18/2020

## 2020-09-18 NOTE — Anesthesia Postprocedure Evaluation (Signed)
Anesthesia Post Note  Patient: Destiny Woods  Procedure(s) Performed: XI ROBOTIC ASSISTED HIATAL HERNIA REPAIR WITH MESH AND FUNDOPLICATION (Abdomen)     Patient location during evaluation: PACU Anesthesia Type: General Level of consciousness: awake and alert Pain management: pain level controlled Vital Signs Assessment: post-procedure vital signs reviewed and stable Respiratory status: spontaneous breathing, nonlabored ventilation, respiratory function stable and patient connected to nasal cannula oxygen Cardiovascular status: blood pressure returned to baseline and stable Postop Assessment: no apparent nausea or vomiting Anesthetic complications: no   No notable events documented.  Last Vitals:  Vitals:   09/17/20 2009 09/18/20 0549  BP: 137/71 132/63  Pulse: 84 85  Resp: 16 18  Temp: 37.2 C 36.9 C  SpO2: 95% 97%    Last Pain:  Vitals:   09/18/20 0549  TempSrc: Oral  PainSc:                  Olney Monier S

## 2020-09-18 NOTE — Progress Notes (Addendum)
Patient had x1 episode of nausea with small, clear emesis while ambulating in the hallway. Patient states N/V relieved after she vomited. Patient still denies flatus, absent bowel sounds.   09/18/20 9794  Gastrointestinal  GI Symptoms Vomiting;Nausea  Nausea relieved by Other (Comment) (patient states relieved by x1 small, clear emesis)

## 2020-09-18 NOTE — Progress Notes (Signed)
Patient returned from xray; spoke with Derrell Lolling, MD; discussed advancing diet and potential discharge if tolerated. Pt on clear liquids, educated, and given menu to order tray for lunch.

## 2020-09-18 NOTE — Discharge Summary (Addendum)
Physician Discharge Summary  Patient ID: Destiny Woods MRN: [id number] DOB/AGE: Jul 23, 1955 65 y.o.  Admit date: 09/17/2020 Discharge date: 09/18/2020  Admission Diagnoses:s/p hhr and fundiplication  Discharge Diagnoses:  Active Problems:   S/P Nissen fundoplication (without gastrostomy tube) procedure   Discharged Condition: good  Hospital Course: 64D with HHR with fundoplication Pt did well post op. POD 1  had a esophagram which showed no leak Pt with no abd pain Pt tol liquids  Consults: None  Significant Diagnostic Studies: DG esoph - no leak  Treatments: surgery: as above  Discharge Exam: Blood pressure 132/63, pulse 85, temperature 98.4 F (36.9 C), temperature source Oral, resp. rate 18, height 5\' 1"  (1.549 m), weight 89.1 kg, SpO2 97 %. General appearance: alert and cooperative GI: soft, non-tender; bowel sounds normal; no masses,  no organomegaly  Disposition: Discharge disposition: 01-Home or Self Care       Discharge Instructions     Diet - low sodium heart healthy   Complete by: As directed    Increase activity slowly   Complete by: As directed       Allergies as of 09/18/2020       Reactions   Pantoprazole Sodium Other (See Comments)   Causing hair breakage        Medication List     STOP taking these medications    famotidine 20 MG tablet Commonly known as: PEPCID   pantoprazole 40 MG tablet Commonly known as: PROTONIX   sucralfate 1 g tablet Commonly known as: Carafate       TAKE these medications    acetaminophen 325 MG tablet Commonly known as: TYLENOL Take 650 mg by mouth daily as needed for headache (pain).   calcium carbonate 600 MG Tabs tablet Commonly known as: OS-CAL Take 600 mg by mouth daily with supper.   Fish Oil 1200 MG Caps Take 1,200 mg by mouth every morning.   HYDROcodone-acetaminophen 7.5-325 mg/15 ml solution Commonly known as: HYCET Take 15 mLs by mouth 4 (four) times daily as needed for  moderate pain.   multivitamin with minerals Tabs tablet Take 1 tablet by mouth daily with supper.   triamterene-hydrochlorothiazide 37.5-25 MG tablet Commonly known as: MAXZIDE-25 Take 0.5 tablets by mouth every morning.   valsartan-hydrochlorothiazide 80-12.5 MG tablet Commonly known as: DIOVAN-HCT Take 1 tablet by mouth every morning.   Vitamin D 50 MCG (2000 UT) tablet Take 2,000 Units by mouth every morning.        Follow-up Information     12-27-1991, MD. Schedule an appointment as soon as possible for a visit in 2 week(s).   Specialty: General Surgery Contact information: [address] [place] [phone]                 Signed: [phone] 09/18/2020, 11:52 AM

## 2020-09-18 NOTE — Plan of Care (Addendum)
RD consulted for nutrition education regarding diet instructions s/p Nissen (pureed diet for 2 weeks post-op).    RD provided Nissen Fundoplication post op diet handout. Encouraged smaller, more frequent meals initially. Discussed avoidance of straws, carbonated beverages, chewing gum to limit introduction of air/gas into abdomen. Reviewed foods allowed on puree diet including foods that are easily mashed with a fork such as a banana. Encouraged pt to blend foods using a blender or food processor and strain to obtain desired consistency (no lumps). Also encouraged use of commercial pureed foods and oral nutritional supplements such as Ensure to provide adequate calories and protein. Discussed how to modify recipes to add calories and protein.    RD discussed why it is important for patient to adhere to diet recommendations and discussed possibility of diet advancement upon MD follow-up. Teach back method used.   Expect good compliance.     Current diet order is FL diet, diet just advanced on visit today. Pt with CL diet at bedside and complains of headache and "gas". Pt reports good appetite prior to admission, eating well with weight gain. Labs and medications reviewed. No further nutrition interventions warranted at this time. RD contact information provided. If additional nutrition issues arise, please re-consult RD.   Romelle Starcher MS, RDN, LDN, CNSC Registered Dietitian III Clinical Nutrition RD Pager and On-Call Pager Number Located in [place]

## 2020-09-19 NOTE — Plan of Care (Signed)
  Problem: Education: Goal: Knowledge of General Education information will improve Description: Including pain rating scale, medication(s)/side effects and non-pharmacologic comfort measures Outcome: Adequate for Discharge   

## 2020-09-19 NOTE — Progress Notes (Signed)
Discharge instructions (including medications) discussed with and copy provided to patient/caregiver 

## 2020-09-19 NOTE — Progress Notes (Signed)
Patient still has not been able to tolerate food, will try again for next tray.

## 2020-09-23 NOTE — Discharge Summary (Signed)
Admit date: 09/17/2020 Discharge date: 09/19/2020   Admission Diagnoses:s/p hhr and fundiplication   Discharge Diagnoses:  Active Problems:   S/P Nissen fundoplication (without gastrostomy tube) procedure     Discharged Condition: good   Hospital Course: 64D with HHR with fundoplication Pt did well post op. POD 1  had a esophagram which showed no leak Pt with no abd pain Pt tol liquids   Consults: None   Significant Diagnostic Studies: DG esoph - no leak   Treatments: surgery: as above   Discharge Exam: Blood pressure 132/63, pulse 85, temperature 98.4 F (36.9 C), temperature source Oral, resp. rate 18, height 5\' 1"  (1.549 m), weight 89.1 kg, SpO2 97 %. General appearance: alert and cooperative GI: soft, non-tender; bowel sounds normal; no masses,  no organomegaly   Disposition: Discharge disposition: 01-Home or Self Care             Discharge Instructions       Diet - low sodium heart healthy   Complete by: As directed      Increase activity slowly   Complete by: As directed           Allergies as of 09/18/2020         Reactions    Pantoprazole Sodium Other (See Comments)    Causing hair breakage            Medication List       STOP taking these medications     famotidine 20 MG tablet Commonly known as: PEPCID    pantoprazole 40 MG tablet Commonly known as: PROTONIX    sucralfate 1 g tablet Commonly known as: Carafate           TAKE these medications     acetaminophen 325 MG tablet Commonly known as: TYLENOL Take 650 mg by mouth daily as needed for headache (pain).    calcium carbonate 600 MG Tabs tablet Commonly known as: OS-CAL Take 600 mg by mouth daily with supper.    Fish Oil 1200 MG Caps Take 1,200 mg by mouth every morning.    HYDROcodone-acetaminophen 7.5-325 mg/15 ml solution Commonly known as: HYCET Take 15 mLs by mouth 4 (four) times daily as needed for moderate pain.    multivitamin with minerals Tabs tablet Take 1  tablet by mouth daily with supper.    triamterene-hydrochlorothiazide 37.5-25 MG tablet Commonly known as: MAXZIDE-25 Take 0.5 tablets by mouth every morning.    valsartan-hydrochlorothiazide 80-12.5 MG tablet Commonly known as: DIOVAN-HCT Take 1 tablet by mouth every morning.    Vitamin D 50 MCG (2000 UT) tablet Take 2,000 Units by mouth every morning.             Follow-up Information       [birth year]-27-1991, MD. Schedule an appointment as soon as possible for a visit in 2 week(s).   Specialty: General Surgery Contact information: [address] [place] [phone]                          Signed: [phone] 09/18/2020, 11:52 AM

## 2020-12-08 ENCOUNTER — Ambulatory Visit: Payer: Commercial Managed Care - PPO | Admitting: Urology

## 2022-05-09 IMAGING — RF DG ESOPHAGUS
18 of 24 series · 18 of 24 positions shown · IV contrast (omnipaque)
Comparison: CT abdomen pelvis 06/17/2020

CLINICAL DATA: Postop Ryady, evaluate for leak

EXAM:
ESOPHOGRAM/BARIUM SWALLOW
TECHNIQUE: Single contrast examination was performed using water-soluble
contrast (Omnipaque 300).
FLUOROSCOPY TIME:  Fluoroscopy Time:  3 minutes 36 seconds
Radiation Exposure Index (if provided by the fluoroscopic device):
54.8 mGy
Number of Acquired Spot Images: 2

[Series 1: cp_standard · 0.62mm/px · 1 of 5 frames shown (1 of 18)]
[frame 1/5]
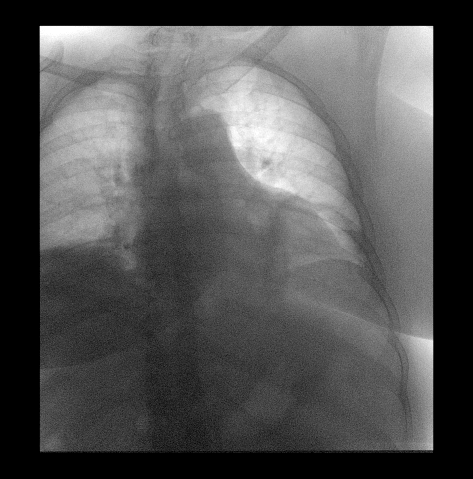

[Series 3: cp_standard · 0.42mm/px · 1 of 258 frames shown (2 of 18)]
[frame 220/258]
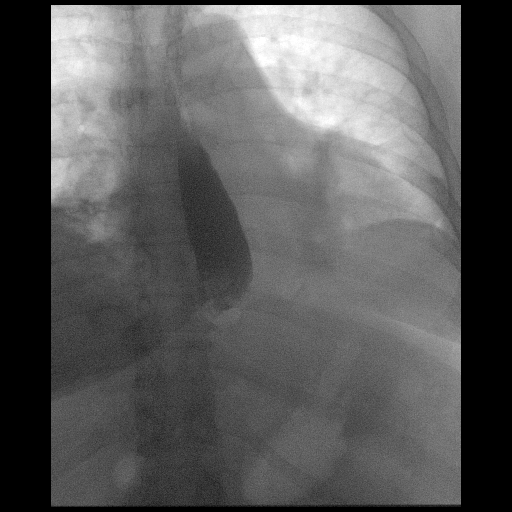

[Series 4: cp_standard · 0.42mm/px · 1 of 71 frames shown (3 of 18)]
[frame 65/71]
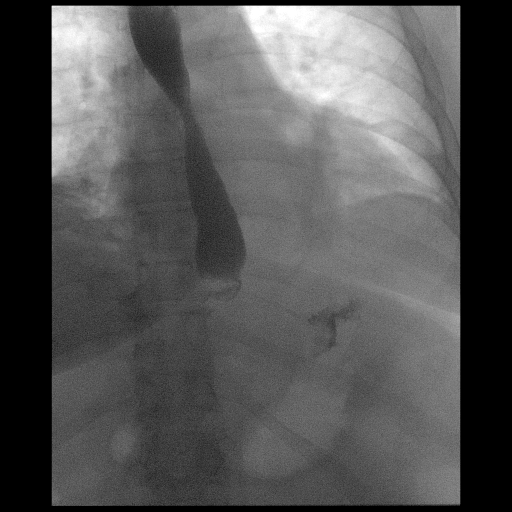

[Series 5: cp_standard · 0.42mm/px · 1 of 19 frames shown (4 of 18)]
[frame 10/19]
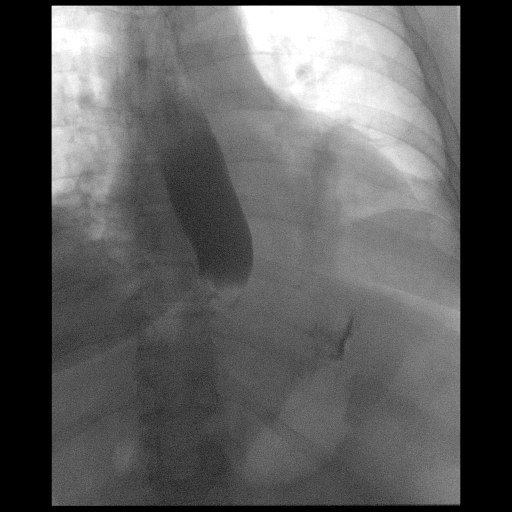

[Series 7: cp_standard · 0.42mm/px · 1 of 82 frames shown (5 of 18)]
[frame 70/82]
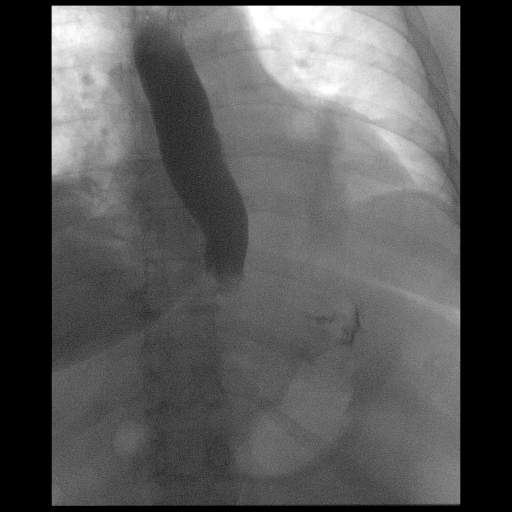

[Series 8: cp_standard · 0.42mm/px · 1 of 16 frames shown (6 of 18)]
[frame 9/16]
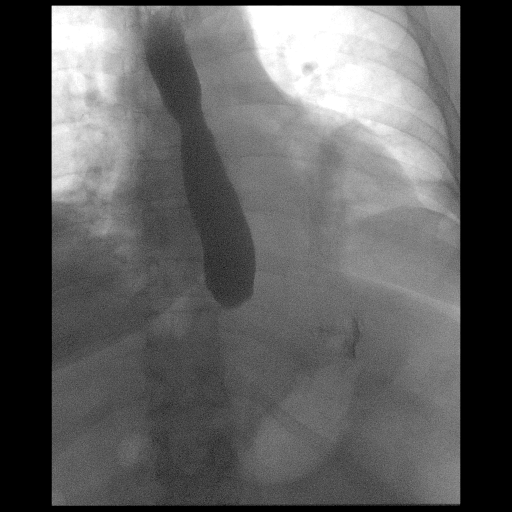

[Series 9: cp_standard · 0.41mm/px · 1 of 31 frames shown (7 of 18)]
[frame 16/31]
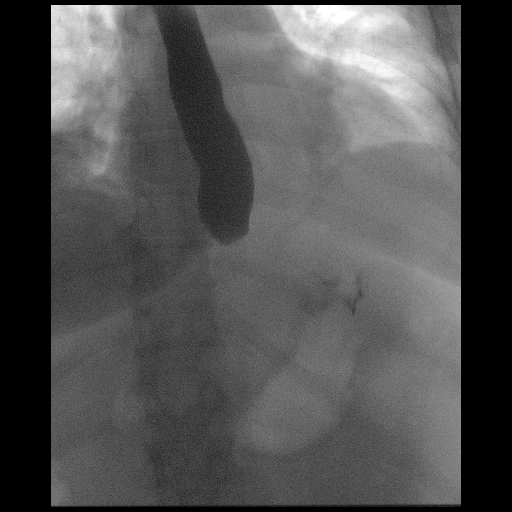

[Series 11: cp_standard · 0.41mm/px · 1 of 16 frames shown (8 of 18)]
[frame 9/16]
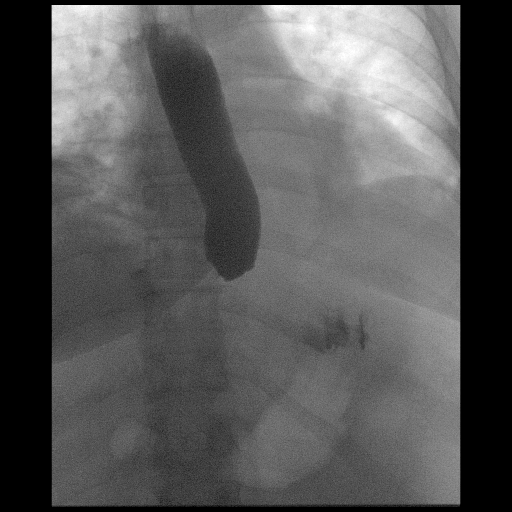

[Series 12: cp_standard · 0.40mm/px · 1 of 3 frames shown (9 of 18)]
[frame 3/3]
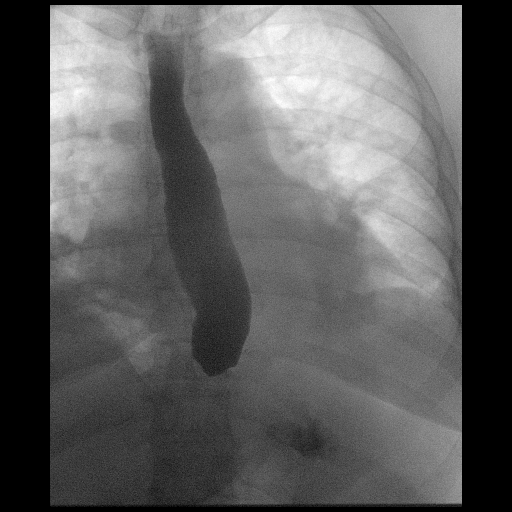

[Series 13: cp_standard · 0.40mm/px · 1 of 76 frames shown (10 of 18)]
[frame 65/76]
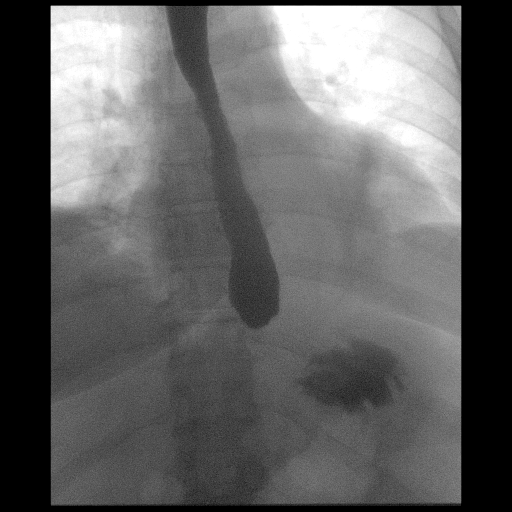

[Series 15: cp_standard · 0.40mm/px · 1 of 22 frames shown (11 of 18)]
[frame 19/22]
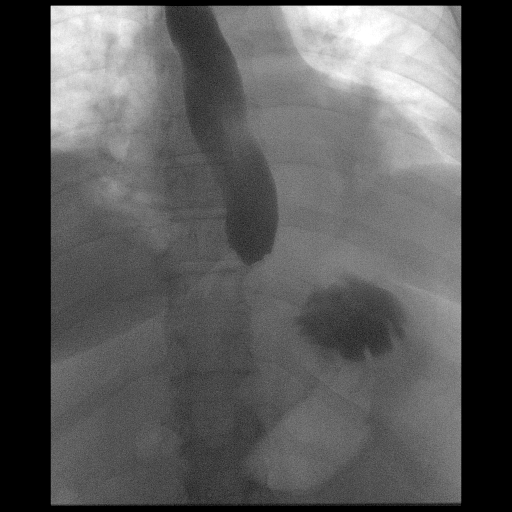

[Series 16: cp_standard · 0.40mm/px · 1 of 24 frames shown (12 of 18)]
[frame 13/24]
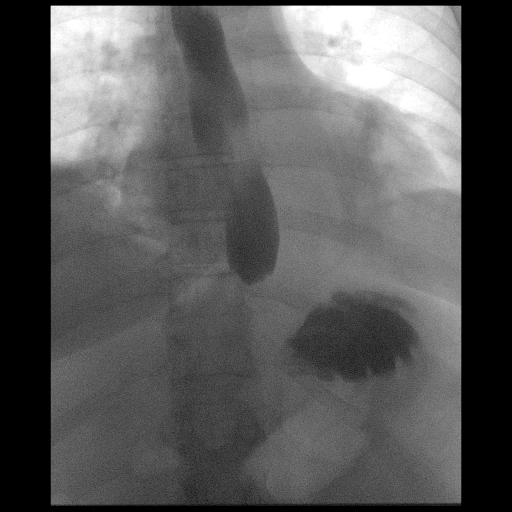

[Series 17: cp_standard · 0.40mm/px · 1 of 5 frames shown (13 of 18)]
[frame 5/5]
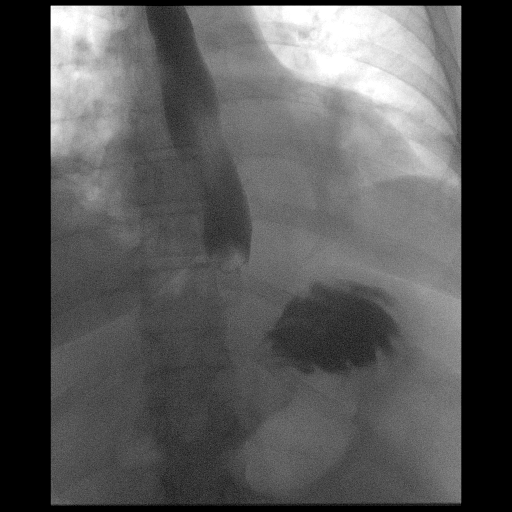

[Series 19: cp_standard · 0.40mm/px · 1 of 36 frames shown (14 of 18)]
[frame 31/36]
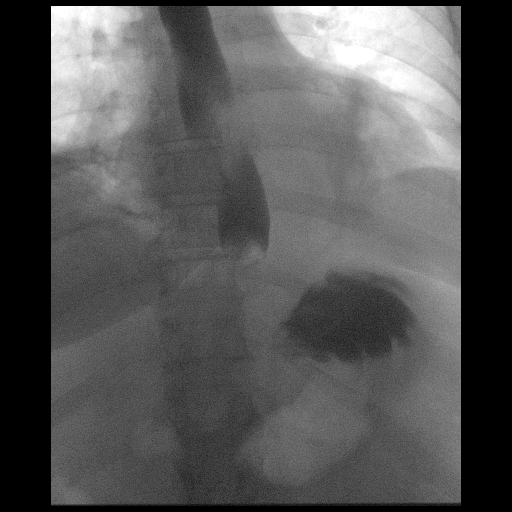

[Series 20: cp_standard · 0.40mm/px · 1 of 13 frames shown (15 of 18)]
[frame 12/13]
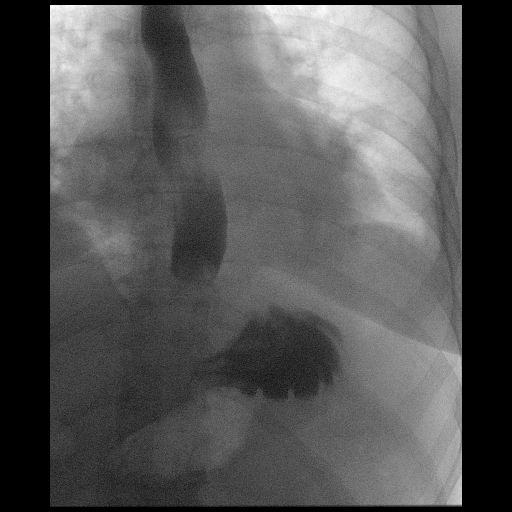

[Series 21: cp_standard · 0.40mm/px · 1 of 3 frames shown (16 of 18)]
[frame 3/3]
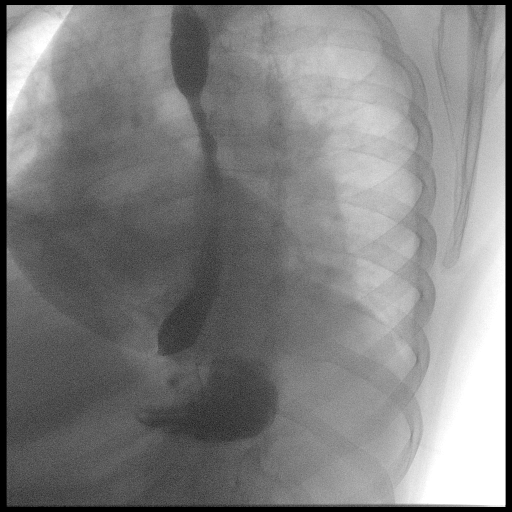

[Series 23: cp_standard · 0.41mm/px · 1 of 56 frames shown (17 of 18)]
[frame 48/56]
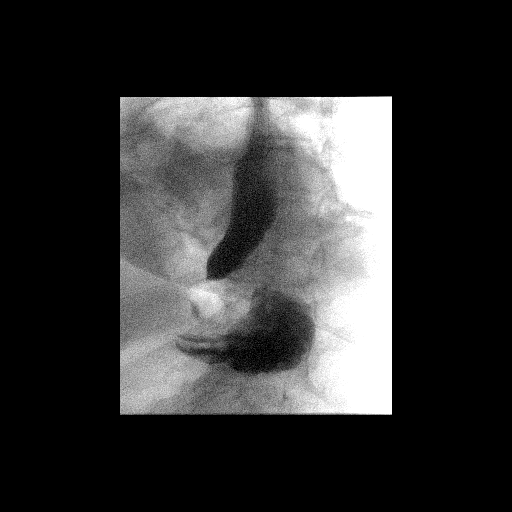

[Series 24: cp_standard · 0.40mm/px · 1 of 45 frames shown (18 of 18)]
[frame 39/45]
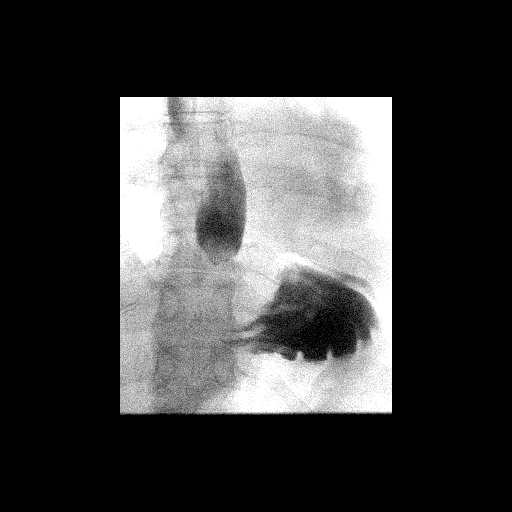

[18 of 24 positions shown; findings below may reference images not displayed]

FINDINGS: There is narrowing of the gastroesophageal junction with delayed
progression of contrast from the esophagus into the stomach. No
extraluminal leak was observed.

Dilated esophagus due to the narrowing at the gastroesophageal
junction/Ryady wrap. No evidence of esophageal mucosal abnormality.

The Ryady wrap appears intact and below the diaphragm.
IMPRESSION: No evidence of extraluminal leak.

Tight Nissen fundoplication with delayed progression of contrast
from the esophagus into the stomach, likely due to postoperative
edema.

## 2023-01-20 ENCOUNTER — Telehealth: Payer: Self-pay

## 2023-01-20 NOTE — Telephone Encounter (Signed)
Spoke to [place], she saw Dr. Lilian Kapur at [facility] and wanted to schedule an appointment. I gave her Dr. Milas Gain schedule. She will have to talk to her daughter first and will call us back to schedule an appointment
